# Patient Record
Sex: Female | Born: 1995 | Hispanic: Yes | Marital: Single | Smoking: Never smoker
Health system: Southern US, Community
[De-identification: ages and names within clinical notes are randomized; demographics above are authoritative.]

## PROBLEM LIST (undated history)

## (undated) DIAGNOSIS — Z789 Other specified health status: Secondary | ICD-10-CM

## (undated) DIAGNOSIS — Z862 Personal history of diseases of the blood and blood-forming organs and certain disorders involving the immune mechanism: Secondary | ICD-10-CM

## (undated) HISTORY — PX: WISDOM TOOTH EXTRACTION: SHX21

## (undated) HISTORY — DX: Other specified health status: Z78.9

## (undated) HISTORY — PX: NO PAST SURGERIES: SHX2092

---
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
## 2013-11-11 ENCOUNTER — Inpatient Hospital Stay (HOSPITAL_COMMUNITY): Admit: 2013-11-11 | Payer: Self-pay

## 2014-11-11 ENCOUNTER — Other Ambulatory Visit (HOSPITAL_COMMUNITY)
Admission: RE | Admit: 2014-11-11 | Payer: BLUE CROSS/BLUE SHIELD | Source: Ambulatory Visit | Admitting: Internal Medicine

## 2014-11-11 ENCOUNTER — Other Ambulatory Visit (HOSPITAL_COMMUNITY)
Admission: RE | Admit: 2014-11-11 | Discharge: 2014-11-11 | Disposition: A | Discharge status: Home / Self Care | Payer: BLUE CROSS/BLUE SHIELD | Source: Ambulatory Visit | Attending: Internal Medicine | Admitting: Internal Medicine

## 2014-11-11 DIAGNOSIS — Z01419 Encounter for gynecological examination (general) (routine) without abnormal findings: Secondary | ICD-10-CM | POA: Diagnosis not present

## 2016-09-16 NOTE — L&D Delivery Note (Signed)
Delivery Note At 5:36 PM a viable female was delivered via Vaginal, Spontaneous (Presentation:occiput anterior  ;  ).  APGAR: 8, 9; weight 7 lb 6 oz (3345 g).   Placenta status: complete , 3 vessel   Cord:  with the following complications: None.  Cord pH: NA  Anesthesia:  Epidural  Episiotomy: None Lacerations: 2nd degree Suture Repair: 3.0 vicryl Est. Blood Loss (mL): 300  Mom to postpartum.  Baby to Couplet care / Skin to Skin.  Rhyen Mazariego J. 09/02/2017, 7:56 AM

## 2017-01-08 LAB — OB RESULTS CONSOLE HEPATITIS B SURFACE ANTIGEN: Hepatitis B Surface Ag: NEGATIVE

## 2017-01-08 LAB — OB RESULTS CONSOLE ABO/RH: RH Type: POSITIVE

## 2017-01-08 LAB — OB RESULTS CONSOLE HIV ANTIBODY (ROUTINE TESTING): HIV: NONREACTIVE

## 2017-01-13 ENCOUNTER — Other Ambulatory Visit: Payer: Self-pay | Admitting: Obstetrics and Gynecology

## 2017-01-13 ENCOUNTER — Other Ambulatory Visit (HOSPITAL_COMMUNITY)
Admission: RE | Admit: 2017-01-13 | Discharge: 2017-01-13 | Disposition: A | Discharge status: Home / Self Care | Payer: BLUE CROSS/BLUE SHIELD | Source: Ambulatory Visit | Attending: Obstetrics and Gynecology | Admitting: Obstetrics and Gynecology

## 2017-01-13 DIAGNOSIS — Z01419 Encounter for gynecological examination (general) (routine) without abnormal findings: Secondary | ICD-10-CM | POA: Diagnosis not present

## 2017-01-13 DIAGNOSIS — Z113 Encounter for screening for infections with a predominantly sexual mode of transmission: Secondary | ICD-10-CM | POA: Insufficient documentation

## 2017-01-14 LAB — CYTOLOGY - PAP: Neisseria Gonorrhea: NEGATIVE

## 2017-08-14 LAB — OB RESULTS CONSOLE GBS: GBS: NEGATIVE

## 2017-08-27 ENCOUNTER — Telehealth (HOSPITAL_COMMUNITY): Payer: Self-pay | Admitting: *Deleted

## 2017-08-27 ENCOUNTER — Encounter (HOSPITAL_COMMUNITY): Payer: Self-pay | Admitting: *Deleted

## 2017-08-27 NOTE — Telephone Encounter (Signed)
Preadmission screen  

## 2017-09-01 ENCOUNTER — Inpatient Hospital Stay (HOSPITAL_COMMUNITY): Payer: Medicaid Other | Admitting: Anesthesiology

## 2017-09-01 ENCOUNTER — Encounter (HOSPITAL_COMMUNITY): Payer: Self-pay | Admitting: *Deleted

## 2017-09-01 ENCOUNTER — Inpatient Hospital Stay (HOSPITAL_COMMUNITY)
Admission: AD | Admit: 2017-09-01 | Discharge: 2017-09-03 | DRG: 806 | Disposition: A | Discharge status: Home / Self Care | Payer: Medicaid Other | Source: Ambulatory Visit | Attending: Obstetrics and Gynecology | Admitting: Obstetrics and Gynecology

## 2017-09-01 ENCOUNTER — Other Ambulatory Visit: Payer: Self-pay

## 2017-09-01 DIAGNOSIS — D693 Immune thrombocytopenic purpura: Secondary | ICD-10-CM | POA: Diagnosis present

## 2017-09-01 DIAGNOSIS — O9912 Other diseases of the blood and blood-forming organs and certain disorders involving the immune mechanism complicating childbirth: Secondary | ICD-10-CM | POA: Diagnosis present

## 2017-09-01 DIAGNOSIS — O1404 Mild to moderate pre-eclampsia, complicating childbirth: Secondary | ICD-10-CM | POA: Diagnosis present

## 2017-09-01 DIAGNOSIS — Z3A40 40 weeks gestation of pregnancy: Secondary | ICD-10-CM

## 2017-09-01 DIAGNOSIS — O1490 Unspecified pre-eclampsia, unspecified trimester: Secondary | ICD-10-CM | POA: Diagnosis present

## 2017-09-01 LAB — URINALYSIS, ROUTINE W REFLEX MICROSCOPIC
Ketones, ur: NEGATIVE mg/dL
Nitrite: NEGATIVE
Protein, ur: 100 mg/dL — AB
RBC / HPF: NONE SEEN RBC/hpf (ref 0–5)
pH: 6 (ref 5.0–8.0)

## 2017-09-01 LAB — CBC
HCT: 33.9 % — ABNORMAL LOW (ref 36.0–46.0)
Hemoglobin: 12 g/dL (ref 12.0–15.0)
Hemoglobin: 12.6 g/dL (ref 12.0–15.0)
Hemoglobin: 10.9 g/dL — ABNORMAL LOW (ref 12.0–15.0)
MCH: 27.2 pg (ref 26.0–34.0)
MCH: 27.5 pg (ref 26.0–34.0)
MCH: 27.1 pg (ref 26.0–34.0)
MCHC: 32.6 g/dL (ref 30.0–36.0)
MCHC: 32.7 g/dL (ref 30.0–36.0)
MCHC: 32.2 g/dL (ref 30.0–36.0)
MCV: 83.4 fL (ref 78.0–100.0)
MCV: 84.3 fL (ref 78.0–100.0)
Platelets: 150 10*3/uL (ref 150–400)
Platelets: 135 10*3/uL — ABNORMAL LOW (ref 150–400)
RBC: 4.41 MIL/uL (ref 3.87–5.11)
RBC: 4.58 MIL/uL (ref 3.87–5.11)
RBC: 4.02 MIL/uL (ref 3.87–5.11)
RDW: 14.1 % (ref 11.5–15.5)
WBC: 11.7 10*3/uL — ABNORMAL HIGH (ref 4.0–10.5)

## 2017-09-01 LAB — COMPREHENSIVE METABOLIC PANEL
ALT: 12 U/L — ABNORMAL LOW (ref 14–54)
Alkaline Phosphatase: 230 U/L — ABNORMAL HIGH (ref 38–126)
Anion gap: 11 (ref 5–15)
Calcium: 8.3 mg/dL — ABNORMAL LOW (ref 8.9–10.3)
Creatinine, Ser: 0.45 mg/dL (ref 0.44–1.00)
GFR calc Af Amer: >60 mL/min (ref >60)
GFR calc non Af Amer: >60 mL/min (ref >60)
Sodium: 135 mmol/L (ref 135–145)
Total Bilirubin: 0.4 mg/dL (ref 0.3–1.2)
Total Protein: 6.4 g/dL — ABNORMAL LOW (ref 6.5–8.1)

## 2017-09-01 LAB — TYPE AND SCREEN
ABO/RH(D): A POS
Antibody Screen: NEGATIVE

## 2017-09-01 LAB — LACTATE DEHYDROGENASE: LDH: 138 U/L (ref 98–192)

## 2017-09-01 LAB — ABO/RH: ABO/RH(D): A POS

## 2017-09-01 LAB — RPR: RPR Ser Ql: NONREACTIVE

## 2017-09-01 LAB — PROTEIN / CREATININE RATIO, URINE: Total Protein, Urine: 49 mg/dL

## 2017-09-01 MED ORDER — SIMETHICONE 80 MG PO CHEW: 80.0000 mg | CHEWABLE_TABLET | ORAL | Status: DC | PRN

## 2017-09-01 MED ORDER — ZOLPIDEM TARTRATE 5 MG PO TABS: 5.0000 mg | ORAL_TABLET | Freq: Every evening | ORAL | Status: DC | PRN

## 2017-09-01 MED ORDER — ONDANSETRON HCL 4 MG/2ML IJ SOLN: 4.0000 mg | Freq: Four times a day (QID) | INTRAMUSCULAR | Status: DC | PRN

## 2017-09-01 MED ORDER — MAGNESIUM SULFATE 40 G IN LACTATED RINGERS - SIMPLE
2.0000 g/h | INTRAVENOUS | Status: AC
  Administered 2017-09-02: 2 g/h via INTRAVENOUS
  Filled 2017-09-01: qty 40
  Filled 2017-09-01: qty 500

## 2017-09-01 MED ORDER — DIPHENHYDRAMINE HCL 50 MG/ML IJ SOLN: 12.5000 mg | INTRAMUSCULAR | Status: DC | PRN

## 2017-09-01 MED ORDER — SENNOSIDES-DOCUSATE SODIUM 8.6-50 MG PO TABS
2.0000 | ORAL_TABLET | ORAL | Status: DC
  Administered 2017-09-01 – 2017-09-03 (×2): 2 via ORAL
  Filled 2017-09-01 (×2): qty 2

## 2017-09-01 MED ORDER — TERBUTALINE SULFATE 1 MG/ML IJ SOLN: 0.2500 mg | Freq: Once | INTRAMUSCULAR | Status: DC | PRN

## 2017-09-01 MED ORDER — HYDRALAZINE HCL 20 MG/ML IJ SOLN: 10.0000 mg | Freq: Once | INTRAMUSCULAR | Status: DC | PRN

## 2017-09-01 MED ORDER — LABETALOL HCL 5 MG/ML IV SOLN: 20.0000 mg | INTRAVENOUS | Status: DC | PRN

## 2017-09-01 MED ORDER — LIDOCAINE HCL (PF) 1 % IJ SOLN
INTRAMUSCULAR | Status: DC | PRN
  Administered 2017-09-01: 6 mL via EPIDURAL
  Administered 2017-09-01: 4 mL

## 2017-09-01 MED ORDER — LACTATED RINGERS IV SOLN
INTRAVENOUS | Status: DC
  Administered 2017-09-02: 50 mL/h via INTRAVENOUS

## 2017-09-01 MED ORDER — PHENYLEPHRINE 40 MCG/ML (10ML) SYRINGE FOR IV PUSH (FOR BLOOD PRESSURE SUPPORT)
80.0000 ug | PREFILLED_SYRINGE | INTRAVENOUS | Status: DC | PRN
  Filled 2017-09-01: qty 5

## 2017-09-01 MED ORDER — ACETAMINOPHEN 325 MG PO TABS: 650.0000 mg | ORAL_TABLET | ORAL | Status: DC | PRN

## 2017-09-01 MED ORDER — BENZOCAINE-MENTHOL 20-0.5 % EX AERO
1.0000 "application " | INHALATION_SPRAY | CUTANEOUS | Status: DC | PRN
  Administered 2017-09-01: 1 via TOPICAL
  Filled 2017-09-01: qty 56

## 2017-09-01 MED ORDER — FENTANYL 2.5 MCG/ML BUPIVACAINE 1/10 % EPIDURAL INFUSION (WH - ANES)
14.0000 mL/h | INTRAMUSCULAR | Status: DC | PRN
  Administered 2017-09-01: 14 mL/h via EPIDURAL
  Filled 2017-09-01: qty 100

## 2017-09-01 MED ORDER — OXYCODONE HCL 5 MG PO TABS: 5.0000 mg | ORAL_TABLET | ORAL | Status: DC | PRN

## 2017-09-01 MED ORDER — OXYCODONE-ACETAMINOPHEN 5-325 MG PO TABS: 2.0000 | ORAL_TABLET | ORAL | Status: DC | PRN

## 2017-09-01 MED ORDER — OXYTOCIN BOLUS FROM INFUSION
500.0000 mL | Freq: Once | INTRAVENOUS | Status: AC
  Administered 2017-09-01: 500 mL via INTRAVENOUS

## 2017-09-01 MED ORDER — MAGNESIUM SULFATE BOLUS VIA INFUSION
4.0000 g | Freq: Once | INTRAVENOUS | Status: AC
  Administered 2017-09-01: 4 g via INTRAVENOUS
  Filled 2017-09-01: qty 500

## 2017-09-01 MED ORDER — EPHEDRINE 5 MG/ML INJ
10.0000 mg | INTRAVENOUS | Status: DC | PRN
  Filled 2017-09-01: qty 2

## 2017-09-01 MED ORDER — WITCH HAZEL-GLYCERIN EX PADS: 1.0000 "application " | MEDICATED_PAD | CUTANEOUS | Status: DC | PRN

## 2017-09-01 MED ORDER — DIBUCAINE 1 % RE OINT: 1.0000 "application " | TOPICAL_OINTMENT | RECTAL | Status: DC | PRN

## 2017-09-01 MED ORDER — OXYCODONE HCL 5 MG PO TABS: 10.0000 mg | ORAL_TABLET | ORAL | Status: DC | PRN

## 2017-09-01 MED ORDER — LACTATED RINGERS IV SOLN
500.0000 mL | Freq: Once | INTRAVENOUS | Status: AC
  Administered 2017-09-01: 1000 mL via INTRAVENOUS

## 2017-09-01 MED ORDER — MAGNESIUM HYDROXIDE 400 MG/5ML PO SUSP: 30.0000 mL | ORAL | Status: DC | PRN

## 2017-09-01 MED ORDER — FLEET ENEMA 7-19 GM/118ML RE ENEM: 1.0000 | ENEMA | RECTAL | Status: DC | PRN

## 2017-09-01 MED ORDER — ONDANSETRON HCL 4 MG PO TABS: 4.0000 mg | ORAL_TABLET | ORAL | Status: DC | PRN

## 2017-09-01 MED ORDER — LACTATED RINGERS IV SOLN: 500.0000 mL | INTRAVENOUS | Status: DC | PRN

## 2017-09-01 MED ORDER — ONDANSETRON HCL 4 MG/2ML IJ SOLN: 4.0000 mg | INTRAMUSCULAR | Status: DC | PRN

## 2017-09-01 MED ORDER — DIPHENHYDRAMINE HCL 25 MG PO CAPS: 25.0000 mg | ORAL_CAPSULE | Freq: Four times a day (QID) | ORAL | Status: DC | PRN

## 2017-09-01 MED ORDER — PRENATAL MULTIVITAMIN CH
1.0000 | ORAL_TABLET | Freq: Every day | ORAL | Status: DC
  Administered 2017-09-02 – 2017-09-03 (×2): 1 via ORAL
  Filled 2017-09-01 (×2): qty 1

## 2017-09-01 MED ORDER — PHENYLEPHRINE 40 MCG/ML (10ML) SYRINGE FOR IV PUSH (FOR BLOOD PRESSURE SUPPORT)
80.0000 ug | PREFILLED_SYRINGE | INTRAVENOUS | Status: DC | PRN
  Filled 2017-09-01: qty 5
  Filled 2017-09-01: qty 10

## 2017-09-01 MED ORDER — OXYTOCIN 40 UNITS IN LACTATED RINGERS INFUSION - SIMPLE MED
1.0000 m[IU]/min | INTRAVENOUS | Status: DC
  Administered 2017-09-01: 2 m[IU]/min via INTRAVENOUS

## 2017-09-01 MED ORDER — OXYCODONE-ACETAMINOPHEN 5-325 MG PO TABS: 1.0000 | ORAL_TABLET | ORAL | Status: DC | PRN

## 2017-09-01 MED ORDER — SOD CITRATE-CITRIC ACID 500-334 MG/5ML PO SOLN: 30.0000 mL | ORAL | Status: DC | PRN

## 2017-09-01 MED ORDER — IBUPROFEN 600 MG PO TABS
600.0000 mg | ORAL_TABLET | Freq: Four times a day (QID) | ORAL | Status: DC
  Administered 2017-09-01 – 2017-09-03 (×7): 600 mg via ORAL
  Filled 2017-09-01 (×7): qty 1

## 2017-09-01 MED ORDER — OXYTOCIN 40 UNITS IN LACTATED RINGERS INFUSION - SIMPLE MED
2.5000 [IU]/h | INTRAVENOUS | Status: DC
  Administered 2017-09-01: 2.5 [IU]/h via INTRAVENOUS
  Filled 2017-09-01: qty 1000

## 2017-09-01 MED ORDER — LIDOCAINE HCL (PF) 1 % IJ SOLN
30.0000 mL | INTRAMUSCULAR | Status: DC | PRN
  Filled 2017-09-01: qty 30

## 2017-09-01 MED ORDER — COCONUT OIL OIL
1.0000 "application " | TOPICAL_OIL | Status: DC | PRN
  Administered 2017-09-01: 1 via TOPICAL
  Filled 2017-09-01: qty 120

## 2017-09-01 MED ORDER — LACTATED RINGERS IV SOLN: INTRAVENOUS | Status: DC

## 2017-09-01 NOTE — Progress Notes (Signed)
Autumn Bridges is a 21 y.o. G1P0000 at 2761w3d by  admitted for induction of labor due to preeclampsia .  Subjective: Contractions more painful. Desires an epidural . No headache/ visual disturbances or ruq pain   Objective: BP 137/73   Pulse 83   Temp 98.4 F (36.9 C) (Oral)   Resp 18   Ht 5\' 2"  (1.575 m)   Wt 68.9 kg (152 lb)   SpO2 99%   BMI 27.80 kg/m  No intake/output data recorded. No intake/output data recorded.  FHT:  FHR: 130 bpm, variability: moderate,  accelerations:  Present,  decelerations:  Absent UC:   regular, every 2 minutes SVE:   Dilation: 3.5 Effacement (%): 90 Station: -2 Exam by:: Dr. Richardson Doppole  Labs: Lab Results  Component Value Date   WBC 11.8 (H) 09/01/2017   HGB 12.6 09/01/2017   HCT 38.5 09/01/2017   MCV 84.1 09/01/2017   PLT 150 09/01/2017    Assessment / Plan: Induction of labor due to preeclampsia,  progressing well on pitocin  Labor: Progressing on Pitocin, will continue to increase then AROM Preeclampsia:  labs stable Fetal Wellbeing:  Category I Pain Control:  planning epidural  I/D:  n/a Anticipated MOD:  NSVD  Jaiel Saraceno J. 09/01/2017, 1:12 PM

## 2017-09-01 NOTE — Anesthesia Preprocedure Evaluation (Signed)

## 2017-09-01 NOTE — MAU Note (Signed)
Pt here with c/o contractions that worsened about 0100. Denies any bleeding or leaking. Lost mucus plug. Reports good fetal movement.

## 2017-09-01 NOTE — Progress Notes (Signed)
Patient requested bottle for formula feeding.Instructions and benefits, advantages and disadvantages of breast feeding given to patient verbally.  Patient still requested bottle for formula feeding.

## 2017-09-01 NOTE — Anesthesia Pain Management Evaluation Note (Signed)
  CRNA Pain Management Visit Note  Patient: Autumn MoroMaria Arellano, 21 y.o., female  "Hello I am a member of the anesthesia team at [facility]. We have an anesthesia team available at all times to provide care throughout the hospital, including epidural management and anesthesia for C-section. I don't know your plan for the delivery whether it a natural birth, water birth, IV sedation, nitrous supplementation, doula or epidural, but we want to meet your pain goals."   1.Was your pain managed to your expectations on prior hospitalizations?   No prior hospitalizations  2.What is your expectation for pain management during this hospitalization?     Epidural and IV pain meds  3.How can we help you reach that goal? Possible epidural  Record the patient's initial score and the patient's pain goal.   Pain: 7  Pain Goal: 8 [facility] wants you to be able to say your pain was always managed very well.  Marjorie Lussier 09/01/2017

## 2017-09-01 NOTE — Progress Notes (Signed)
Autumn Bridges is a 21 y.o. G1P0000 at 4565w3d by  admitted for induction of labor due to preeclampsia .  Subjective: Patient denies headache or visual disturbances. Contractions are mild   Objective: BP 137/73   Pulse 83   Temp 98.4 F (36.9 C) (Oral)   Resp 18   Ht 5\' 2"  (1.575 m)   Wt 68.9 kg (152 lb)   SpO2 99%   BMI 27.80 kg/m  No intake/output data recorded. No intake/output data recorded.  FHT:  FHR: 130 bpm, variability: moderate,  accelerations:  Present,  decelerations:  Absent UC:   irregular, every 5 minutes SVE:   2.5/80/-2 Labs: Lab Results  Component Value Date   WBC 11.8 (H) 09/01/2017   HGB 12.6 09/01/2017   HCT 38.5 09/01/2017   MCV 84.1 09/01/2017   PLT 150 09/01/2017    Assessment / Plan: start pitocin for augmentation   Labor: start pitocin 2x2 Preeclampsia:  labs stable Fetal Wellbeing:  Category I Pain Control:  Labor support without medications I/D:  n/a Anticipated MOD:  NSVD  Autumn Bridges J. 09/01/2017, 1:10 PM

## 2017-09-01 NOTE — MAU Note (Addendum)
Pt reports strong uc's since 0100

## 2017-09-01 NOTE — Anesthesia Procedure Notes (Signed)
Epidural Patient location during procedure: OB  Staffing Anesthesiologist: Marek Nghiem, MD  Preanesthetic Checklist Completed: patient identified, pre-op evaluation, timeout performed, IV checked, risks and benefits discussed and monitors and equipment checked  Epidural Patient position: sitting Prep: DuraPrep Patient monitoring: blood pressure and continuous pulse ox Approach: right paramedian Location: L3-L4 Injection technique: LOR air  Needle:  Needle type: Tuohy  Needle gauge: 17 G Needle insertion depth: 4 cm Catheter type: closed end flexible Catheter size: 19 Gauge Catheter at skin depth: 10 cm Test dose: negative  Assessment Sensory level: T8  Additional Notes  Dosing of Epidural:  1st dose, through catheter .............................................  Xylocaine 40 mg  2nd dose, through catheter, after waiting 3 minutes.........Xylocaine 60 mg    As each dose occurred, patient was free of IV sx; and patient exhibited no evidence of SA injection.  Patient is more comfortable after epidural dosed. Please see RN's note for documentation of vital signs,and FHR which are stable.  Patient reminded not to try to ambulate with numb legs, and that an RN must be present when she attempts to get up.           

## 2017-09-01 NOTE — H&P (Signed)
Autumn Bridges is a 21 y.o. female, G1P0 at 40.3 weeks, presenting for contractions.  Denies leaking fluid or bloody show. FM+.  Denies headache or blurred vision.  Prenatal hx remarkable for hx of idiopathic thrombocytopenia.    Patient Active Problem List   Diagnosis Date Noted  . Indication for care in labor or delivery 09/01/2017    History of present pregnancy: Patient entered care at 7.3 weeks.   EDC of 08/29/2017 was established by LMP.   Anatomy scan:  20 weeks, with normal findings Additional US evaluations:  None Significant prenatal events:  None Last evaluation:  Last week  OB History    Gravida Para Term Preterm AB Living   1             SAB TAB Ectopic Multiple Live Births                 Past Medical History:  Diagnosis Date  . Medical history non-contributory    Past Surgical History:  Procedure Laterality Date  . NO PAST SURGERIES     Family History: family history includes Diabetes in her mother. Social History:  reports that  has never smoked. she has never used smokeless tobacco. She reports that she does not drink alcohol or use drugs.   Prenatal Transfer Tool  Maternal Diabetes: No Genetic Screening: Declined Maternal Ultrasounds/Referrals: Normal Fetal Ultrasounds or other Referrals:  None Maternal Substance Abuse:  No Significant Maternal Medications:  None Significant Maternal Lab Results: None  TDAP No Flu Yes  ROS:  All 10 systems reviewed and neg except as stated above.  No Known Allergies   Dilation: 2.5 Effacement (%): 80 Station: -2 Exam by:: Salia Cangemi CNM Blood pressure (!) 141/85, pulse 87, temperature 98.1 F (36.7 C), temperature source Oral, resp. rate 16, height 5\' 2"  (1.575 m), weight 68.9 kg (152 lb), SpO2 99 %.  Chest clear Heart RRR without murmur Abd gravid, NT, FH appropriate EFW 7.5 lbs Pelvic: adequate Ext: +3 refexes, no clonus, no edema  FHR: Category 1 UCs:  3 in 10 minutes  Prenatal labs: ABO,  Rh: A/Positive/-- (04/25 0000) Antibody: Negative (04/25 0000) Rubella:  Immune (04/25 0000) RPR: Nonreactive (04/25 0000)  HBsAg: Negative (04/25 0000)  HIV: Non-reactive (04/25 0000)  GBS: Negative (11/29 0000) Sickle cell/Hgb electrophoresis:  AA Pap:  2018 negative GC: Neg Chlamydia: Neg Genetic screenings:  Declined Glucola:  98 Other:   Hgb 13.6 at NOB, 11.8 at 28 weeks  Results for orders placed or performed during the hospital encounter of 09/01/17 (from the past 24 hour(s))  Urinalysis, Routine w reflex microscopic     Status: Abnormal   Collection Time: 09/01/17  2:45 AM  Result Value Ref Range   Color, Urine YELLOW YELLOW   APPearance CLEAR CLEAR   Specific Gravity, Urine 1.021 1.005 - 1.030   pH 6.0 5.0 - 8.0   Glucose, UA NEGATIVE NEGATIVE mg/dL   Hgb urine dipstick NEGATIVE NEGATIVE   Bilirubin Urine NEGATIVE NEGATIVE   Ketones, ur NEGATIVE NEGATIVE mg/dL   Protein, ur 161100 (A) NEGATIVE mg/dL   Nitrite NEGATIVE NEGATIVE   Leukocytes, UA NEGATIVE NEGATIVE   RBC / HPF NONE SEEN 0 - 5 RBC/hpf   WBC, UA 0-5 0 - 5 WBC/hpf   Bacteria, UA RARE (A) NONE SEEN   Squamous Epithelial / LPF 0-5 (A) NONE SEEN   Mucus PRESENT   Protein / creatinine ratio, urine     Status: Abnormal   Collection Time:  09/01/17  2:45 AM  Result Value Ref Range   Creatinine, Urine 161.00 mg/dL   Total Protein, Urine 49 mg/dL   Protein Creatinine Ratio 0.30 (H) 0.00 - 0.15 mg/mg[Cre]  CBC     Status: Abnormal   Collection Time: 09/01/17  3:23 AM  Result Value Ref Range   WBC 7.8 4.0 - 10.5 K/uL   RBC 4.41 3.87 - 5.11 MIL/uL   Hemoglobin 12.0 12.0 - 15.0 g/dL   HCT 40.936.8 81.136.0 - 91.446.0 %   MCV 83.4 78.0 - 100.0 fL   MCH 27.2 26.0 - 34.0 pg   MCHC 32.6 30.0 - 36.0 g/dL   RDW 78.213.9 95.611.5 - 21.315.5 %   Platelets 142 (L) 150 - 400 K/uL  Comprehensive metabolic panel     Status: Abnormal   Collection Time: 09/01/17  3:23 AM  Result Value Ref Range   Sodium 135 135 - 145 mmol/L   Potassium 3.8 3.5  - 5.1 mmol/L   Chloride 106 101 - 111 mmol/L   CO2 18 (L) 22 - 32 mmol/L   Glucose, Bld 84 65 - 99 mg/dL   BUN 14 6 - 20 mg/dL   Creatinine, Ser 0.860.45 0.44 - 1.00 mg/dL   Calcium 8.3 (L) 8.9 - 10.3 mg/dL   Total Protein 6.4 (L) 6.5 - 8.1 g/dL   Albumin 2.8 (L) 3.5 - 5.0 g/dL   AST 20 15 - 41 U/L   ALT 12 (L) 14 - 54 U/L   Alkaline Phosphatase 230 (H) 38 - 126 U/L   Total Bilirubin 0.4 0.3 - 1.2 mg/dL   GFR calc non Af Amer >60 >60 mL/min   GFR calc Af Amer >60 >60 mL/min   Anion gap 11 5 - 15      Assessment/Plan: IUP at 40.3 pre eclampsia with mild features Cat 1 strip Hx of idiopathic thrombocytopenia  Plan: Admit to Birthing Suite  Routine [facility] orders Pain med/epidural prn Pitocin if needed  Henderson NewcomerNancy Jean ProtheroCNM, MSN 09/01/2017, 5:19 AM

## 2017-09-02 LAB — CBC
MCHC: 32.7 g/dL (ref 30.0–36.0)
Platelets: 136 10*3/uL — ABNORMAL LOW (ref 150–400)
RDW: 14.1 % (ref 11.5–15.5)

## 2017-09-02 LAB — COMPREHENSIVE METABOLIC PANEL
Albumin: 2.3 g/dL — ABNORMAL LOW (ref 3.5–5.0)
Anion gap: 7 (ref 5–15)
CO2: 19 mmol/L — ABNORMAL LOW (ref 22–32)
Glucose, Bld: 93 mg/dL (ref 65–99)
Potassium: 3.9 mmol/L (ref 3.5–5.1)
Sodium: 133 mmol/L — ABNORMAL LOW (ref 135–145)

## 2017-09-02 NOTE — Progress Notes (Signed)
Post Partum Day 1 Subjective: no complaints, up ad lib, voiding and tolerating PO Denies headache visual disturbances or ruq pain  Objective: Blood pressure 121/61, pulse 82, temperature 98.2 F (36.8 C), temperature source Oral, resp. rate 16, height 5\' 2"  (1.575 m), weight 66.6 kg (146 lb 12 oz), SpO2 99 %, unknown if currently breastfeeding.  Physical Exam:  General: alert, cooperative and no distress Lochia: appropriate Uterine Fundus: firm Incision: NA DVT Evaluation: No evidence of DVT seen on physical exam.  Recent Labs    09/01/17 1817 09/02/17 0521  HGB 10.9* 10.8*  HCT 33.9* 33.0*    Assessment/Plan: Breastfeeding and Lactation consult  Preeclampsia. Continue magnesium for total of 24 hours from start  Possible discharge home tomorrow if bp well controlled after cessation of Magnesium sulfate.     LOS: 1 day   Geral Coker J. 09/02/2017, 8:02 AM

## 2017-09-02 NOTE — Lactation Note (Addendum)
This note was copied from a baby's chart. Lactation Consultation Note  Patient Name: Autumn Andree MoroMaria Arellano RUEAV'WToday's Date: 09/02/2017 Reason for consult: Initial assessment Baby at 21 hours of life. Mom desires to offer breast and formula bottles. She reports baby is not latching well. Nipples are intact, no bruising noted, positive for areolar edema, reviewed reverse pressure, breast shells were given. Discussed baby behavior, feeding frequency, pumping, supplementing, artifical nipple, baby belly size, voids, wt loss, breast changes, and nipple care. Demonstrated manual expression, colostrum noted bilaterally, reviewed spoon feeding. Given lactation handouts. Aware of OP services and support group.       Maternal Data    Feeding    LATCH Score                   Interventions Interventions: Breast feeding basics reviewed;Breast massage;Hand express;Reverse pressure;Breast compression;Expressed milk;Coconut oil;Shells;DEBP;Ice  Lactation Tools Discussed/Used     Consult Status Consult Status: Follow-up Date: 09/03/17 Follow-up type: In-patient   Milagros S. Pasty Archeck Demoni Gergen E Sequoia Witz 09/02/2017, 2:59 PM

## 2017-09-02 NOTE — Anesthesia Postprocedure Evaluation (Signed)
Anesthesia Post Note  Patient: Autumn MoroMaria Bridges  Procedure(s) Performed: AN AD HOC LABOR EPIDURAL     Patient location during evaluation: [facility] Anesthesia Type: Epidural Level of consciousness: awake and alert Pain management: pain level controlled Vital Signs Assessment: post-procedure vital signs reviewed and stable Respiratory status: spontaneous breathing, nonlabored ventilation and respiratory function stable Cardiovascular status: stable Postop Assessment: no headache, no backache and epidural receding Anesthetic complications: no    Last Vitals:  Vitals:   09/02/17 0530 09/02/17 0630  BP: 124/75 121/61  Pulse: 82 82  Resp: 16 16  Temp:    SpO2: 98% 99%    Last Pain:  Vitals:   09/02/17 0730  TempSrc:   PainSc: 0-No pain   Pain Goal: Patients Stated Pain Goal: 3 (09/02/17 0730)               Junious SilkGILBERT,Jeidi Gilles

## 2017-09-03 DIAGNOSIS — O1490 Unspecified pre-eclampsia, unspecified trimester: Secondary | ICD-10-CM | POA: Diagnosis present

## 2017-09-03 MED ORDER — IBUPROFEN 600 MG PO TABS: 600.0000 mg | ORAL_TABLET | Freq: Four times a day (QID) | ORAL | 0 refills | Status: DC | PRN

## 2017-09-03 NOTE — Discharge Summary (Signed)
OB Discharge Summary     Patient Name: Autumn Bridges DOB: 12/13/1995 MRN: [id number]  Date of admission: 09/01/2017 Delivering MD: Gerald LeitzOLE, Namira Rosekrans   Date of discharge: 09/03/2017  Admitting diagnosis: 40.3 WEEKS CTX with preeclampsia  Intrauterine pregnancy: 9874w3d     Secondary diagnosis:  Active Problems:   Indication for care in labor or delivery   Preeclampsia  Additional problems: preeclampsia      Discharge diagnosis: Term Pregnancy Delivered and Preeclampsia (mild)                                                                                                Post partum procedures:NA  Augmentation: Pitocin  Complications: None  Hospital course:  Induction of Labor With Vaginal Delivery   21 y.o. yo G1P1001 at 374w3d was admitted to the hospital 09/01/2017 for induction of labor.  Indication for induction: Preeclampsia.  Patient had an uncomplicated labor course as follows: Membrane Rupture Time/Date: 4:09 PM ,09/01/2017   Intrapartum Procedures: Episiotomy: None [1]                                         Lacerations:  2nd degree [3]  Patient had delivery of a Viable infant.  Information for the patient's newborn:  Lyanne Corellano, Girl Zuria [[id number]][[id number]]  Delivery Method: Vag-Spont   09/01/2017  Details of delivery can be found in separate delivery note.  Patient had a routine postpartum course. Patient is discharged home 09/03/17.  Physical exam  Vitals:   09/03/17 0010 09/03/17 0453 09/03/17 0800 09/03/17 1200  BP: (!) 116/53 (!) 107/55 128/67 127/67  Pulse: 69 62 71 63  Resp: 16 16 16 18   Temp: 98.6 F (37 C) 98.2 F (36.8 C) 98.4 F (36.9 C) 98 F (36.7 C)  TempSrc: Oral Oral Oral Oral  SpO2: 99% 100% 99%   Weight:      Height:       General: alert, cooperative and no distress Lochia: appropriate Uterine Fundus: firm Incision:NA DVT Evaluation: No evidence of DVT seen on physical exam. Labs: Lab Results  Component Value Date   WBC 13.0 (H) 09/02/2017    HGB 10.8 (L) 09/02/2017   HCT 33.0 (L) 09/02/2017   MCV 84.0 09/02/2017   PLT 136 (L) 09/02/2017   CMP Latest Ref Rng & Units 09/02/2017  Glucose 65 - 99 mg/dL 93  BUN 6 - 20 mg/dL 7  Creatinine 7.820.44 - 9.561.00 mg/dL 2.130.52  Sodium 086135 - 578145 mmol/L 133(L)  Potassium 3.5 - 5.1 mmol/L 3.9  Chloride 101 - 111 mmol/L 107  CO2 22 - 32 mmol/L 19(L)  Calcium 8.9 - 10.3 mg/dL 6.9(L)  Total Protein 6.5 - 8.1 g/dL 5.3(L)  Total Bilirubin 0.3 - 1.2 mg/dL 4.6(N0.2(L)  Alkaline Phos 38 - 126 U/L 166(H)  AST 15 - 41 U/L 26  ALT 14 - 54 U/L 13(L)    Discharge instruction: per After Visit Summary and "Baby and Me Booklet".  After visit meds:  Allergies as of 09/03/2017  No Known Allergies     Medication List    TAKE these medications   ibuprofen 600 MG tablet Commonly known as:  ADVIL,MOTRIN Take 1 tablet (600 mg total) by mouth every 6 (six) hours as needed.   prenatal multivitamin Tabs tablet Take 1 tablet by mouth daily at 12 noon.       Diet: low salt diet  Activity: Advance as tolerated. Pelvic rest for 6 weeks.   Outpatient follow up:1 week bp check  Follow up Appt:No future appointments. Follow up Visit:No Follow-up on file.  Postpartum contraception: Not Discussed  Newborn Data: Live born female  Birth Weight: 7 lb 6 oz (3345 g) APGAR: 8, 9  Newborn Delivery   Birth date/time:  09/01/2017 17:36:00 Delivery type:  Vaginal, Spontaneous     Baby Feeding: Breast Disposition:home with mother   09/03/2017 Jessee AversOLE,Desyre Calma J., MD

## 2017-09-04 NOTE — Addendum Note (Signed)
Addendum  created 09/04/17 0732 by Achille RichHodierne, Fynley Chrystal, MD   Intraprocedure Staff edited

## 2017-09-06 ENCOUNTER — Inpatient Hospital Stay (HOSPITAL_COMMUNITY): Admission: RE | Admit: 2017-09-06 | Payer: Medicaid Other | Source: Ambulatory Visit

## 2021-01-01 ENCOUNTER — Encounter (HOSPITAL_COMMUNITY): Payer: Self-pay | Admitting: Obstetrics and Gynecology

## 2021-01-01 ENCOUNTER — Other Ambulatory Visit: Payer: Self-pay

## 2021-01-01 ENCOUNTER — Inpatient Hospital Stay (HOSPITAL_BASED_OUTPATIENT_CLINIC_OR_DEPARTMENT_OTHER): Payer: Medicaid Other

## 2021-01-01 ENCOUNTER — Inpatient Hospital Stay (HOSPITAL_COMMUNITY)
Admission: AD | Admit: 2021-01-01 | Discharge: 2021-01-01 | Disposition: A | Discharge status: Home / Self Care | Payer: Medicaid Other | Attending: Obstetrics and Gynecology | Admitting: Obstetrics and Gynecology

## 2021-01-01 DIAGNOSIS — O039 Complete or unspecified spontaneous abortion without complication: Secondary | ICD-10-CM | POA: Diagnosis not present

## 2021-01-01 DIAGNOSIS — O209 Hemorrhage in early pregnancy, unspecified: Secondary | ICD-10-CM | POA: Diagnosis present

## 2021-01-01 DIAGNOSIS — Z791 Long term (current) use of non-steroidal anti-inflammatories (NSAID): Secondary | ICD-10-CM | POA: Insufficient documentation

## 2021-01-01 DIAGNOSIS — O034 Incomplete spontaneous abortion without complication: Secondary | ICD-10-CM

## 2021-01-01 DIAGNOSIS — Z3A14 14 weeks gestation of pregnancy: Secondary | ICD-10-CM | POA: Diagnosis not present

## 2021-01-01 DIAGNOSIS — O30001 Twin pregnancy, unspecified number of placenta and unspecified number of amniotic sacs, first trimester: Secondary | ICD-10-CM

## 2021-01-01 HISTORY — DX: Personal history of diseases of the blood and blood-forming organs and certain disorders involving the immune mechanism: Z86.2

## 2021-01-01 LAB — URINALYSIS, ROUTINE W REFLEX MICROSCOPIC
Bilirubin Urine: NEGATIVE
Glucose, UA: NEGATIVE mg/dL
Ketones, ur: NEGATIVE mg/dL
Nitrite: NEGATIVE
Protein, ur: NEGATIVE mg/dL
Specific Gravity, Urine: 1.004 — ABNORMAL LOW (ref 1.005–1.030)
pH: 8 (ref 5.0–8.0)

## 2021-01-01 LAB — CBC
HCT: 38.4 % (ref 36.0–46.0)
Hemoglobin: 13.1 g/dL (ref 12.0–15.0)
MCH: 29.6 pg (ref 26.0–34.0)
MCHC: 34.1 g/dL (ref 30.0–36.0)
MCV: 86.9 fL (ref 80.0–100.0)
Platelets: 221 10*3/uL (ref 150–400)
RBC: 4.42 MIL/uL (ref 3.87–5.11)
RDW: 13.4 % (ref 11.5–15.5)
WBC: 6.9 10*3/uL (ref 4.0–10.5)
nRBC: 0 % (ref 0.0–0.2)

## 2021-01-01 LAB — POCT PREGNANCY, URINE: Preg Test, Ur: POSITIVE — AB

## 2021-01-01 LAB — HCG, QUANTITATIVE, PREGNANCY: hCG, Beta Chain, Quant, S: 6679 m[IU]/mL — ABNORMAL HIGH (ref <5)

## 2021-01-01 NOTE — MAU Provider Note (Signed)
History     CSN: [id number]  Arrival date and time: 01/01/21 1706   Event Date/Time   First Provider Initiated Contact with Patient 01/01/21 1737      Chief Complaint  Patient presents with  . Vaginal Bleeding   HPI Autumn Bridges is a 25 y.o. G2P1001 at [redacted]w[redacted]d with twin gestation who presents to MAU with chief complaint of vaginal bleeding. This is a new problem, onset shortly before patient's arrival to MAU. She endorses seeing a small amount of bright red spotting.  She denies heavy vaginal bleeding, abdominal pain, abdominal tenderness, fever or recent illness. She is remote from sexual intercourse.  Patient also c/o intermittent headache and mildly blurry vision. She denies HTN. She denies severe headache pain. Pain score 0/10 on arrival to MAU.  Patient has initiated care with [facility].  OB History    Gravida  2   Para  1   Term  1   Preterm  0   AB  0   Living  1     SAB  0   IAB  0   Ectopic  0   Multiple  0   Live Births  1           Past Medical History:  Diagnosis Date  . History of ITP   . Medical history non-contributory     Past Surgical History:  Procedure Laterality Date  . NO PAST SURGERIES    . WISDOM TOOTH EXTRACTION      Family History  Problem Relation Age of Onset  . Diabetes Mother     Social History   Tobacco Use  . Smoking status: Never Smoker  . Smokeless tobacco: Never Used  Vaping Use  . Vaping Use: Never used  Substance Use Topics  . Alcohol use: No  . Drug use: No    Allergies: No Known Allergies  Medications Prior to Admission  Medication Sig Dispense Refill Last Dose  . cholecalciferol (VITAMIN D3) 25 MCG (1000 UNIT) tablet Take 1,000 Units by mouth daily.   01/01/2021 at Unknown time  . Prenatal Vit-Fe Fumarate-FA (PRENATAL MULTIVITAMIN) TABS tablet Take 1 tablet by mouth daily at 12 noon.   01/01/2021 at Unknown time  . ibuprofen (ADVIL,MOTRIN) 600 MG tablet Take 1 tablet (600 mg total) by mouth  every 6 (six) hours as needed. 30 tablet 0     Review of Systems  Genitourinary: Positive for vaginal bleeding.  All other systems reviewed and are negative.  Physical Exam   Blood pressure 118/64, pulse 70, temperature 97.9 F (36.6 C), temperature source Oral, resp. rate 16, height 5\' 1"  (1.549 m), weight 58.2 kg, SpO2 100 %, unknown if currently breastfeeding.  Physical Exam Vitals and nursing note reviewed. Exam conducted with a chaperone present.  Constitutional:      Appearance: Normal appearance.  Cardiovascular:     Rate and Rhythm: Normal rate.     Pulses: Normal pulses.     Heart sounds: Normal heart sounds.  Pulmonary:     Effort: Pulmonary effort is normal.     Breath sounds: Normal breath sounds.  Abdominal:     Palpations: Abdomen is soft.     Tenderness: There is no right CVA tenderness or left CVA tenderness.  Skin:    Capillary Refill: Capillary refill takes less than 2 seconds.  Neurological:     Mental Status: She is alert and oriented to person, place, and time.  Psychiatric:  Mood and Affect: Mood normal.        Behavior: Behavior normal.        Thought Content: Thought content normal.        Judgment: Judgment normal.     MAU Course  Procedures  --IUP with twin gestation previously confirmed in office with [facility] --Able to obtain clear view of both Baby A and Baby B on BSUS. Absent movement and absent cardiac activity x 2. Dr. Adrian Blackwater at bedside, identical assessment. Formal ultrasound ordered. --Rhea Pink, CNM contacted to coordinate treatment options and followup in office. Requests I contact Dr. Normand Sloop. Dr. Normand Sloop notified, states patient should be d/c'd home if stable with plan to follow up in office on Wednesday for discussion of next steps.  Patient Vitals for the past 24 hrs:  BP Temp Temp src Pulse Resp SpO2 Height Weight  01/01/21 1903 118/71 -- -- 74 14 100 % -- --  01/01/21 1737 120/65 -- -- 69 -- -- -- --  01/01/21 1722  118/64 97.9 F (36.6 C) Oral 70 16 100 % 5\' 1"  (1.549 m) 58.2 kg   Results for orders placed or performed during the hospital encounter of 01/01/21 (from the past 24 hour(s))  Pregnancy, urine POC     Status: Abnormal   Collection Time: 01/01/21  5:22 PM  Result Value Ref Range   Preg Test, Ur POSITIVE (A) NEGATIVE  Urinalysis, Routine w reflex microscopic Urine, Clean Catch     Status: Abnormal   Collection Time: 01/01/21  5:36 PM  Result Value Ref Range   Color, Urine STRAW (A) YELLOW   APPearance CLEAR CLEAR   Specific Gravity, Urine 1.004 (L) 1.005 - 1.030   pH 8.0 5.0 - 8.0   Glucose, UA NEGATIVE NEGATIVE mg/dL   Hgb urine dipstick MODERATE (A) NEGATIVE   Bilirubin Urine NEGATIVE NEGATIVE   Ketones, ur NEGATIVE NEGATIVE mg/dL   Protein, ur NEGATIVE NEGATIVE mg/dL   Nitrite NEGATIVE NEGATIVE   Leukocytes,Ua TRACE (A) NEGATIVE   WBC, UA 0-5 0 - 5 WBC/hpf   Bacteria, UA RARE (A) NONE SEEN   Squamous Epithelial / LPF 0-5 0 - 5   Mucus PRESENT    Assessment and Plan  --25 y.o. G2P1001 at [redacted]w[redacted]d  --Demise of both twins confirmed via formal ultrasound --Scant bleeding, Hgb 13.1 --Blood type A POS --Per Dr. [redacted]w[redacted]d, discharge home in stable condition  F/U: --Dr. Normand Sloop to coordinate evaluation in office on Wednesday 01/03/2021  01/05/2021, CNM 01/01/2021, 7:29 PM

## 2021-01-01 NOTE — MAU Note (Addendum)
Her vision is blurring today.  Had a HA earlier,gone now. Not having any pain. Had a pinch pain in RLQ earlier- brief.  When she got home she realized she was spotting.  Is pregnant with twins, per [place]. Has started care at [facility]

## 2021-01-01 NOTE — Discharge Instructions (Signed)
Miscarriage A miscarriage is the loss of a pregnancy before the 20th week of pregnancy. Sometimes, a pregnancy ends before a woman knows that she is pregnant. If you lose a pregnancy, talk with your doctor about:  Questions you have about the loss of your baby.  How to work through your grief.  Plans for future pregnancy. What are the causes? Many times, the cause of this condition is not known. What increases the risk? These things may make a pregnant woman more likely to lose a pregnancy: Certain health conditions  Conditions that affect hormones, such as: ? Thyroid disease. ? Polycystic ovary syndrome.  Diabetes.  A disease that causes the body's disease-fighting system to attack itself by mistake.  Infections.  Bleeding problems.  Being very overweight. Lifestyle factors  Using products that have tobacco or nicotine in them.  Being around tobacco smoke.  Having alcohol.  Having a lot of caffeine.  Using drugs. Problems with reproductive organs or parts  Having a cervix that opens and thins before your due date. The cervix is the lowest part of your womb.  Having Asherman syndrome, which leads to: ? Scars in the womb. ? The womb being abnormal in shape.  Growths (fibroids) in the womb.  Problems in the body that are present at birth.  Infection of the cervix or womb. Personal or health history  Injury.  Having lost a pregnancy before.  Being younger than age 18 or older than age 35.  Being around a harmful substance, such as radiation.  Having lead or other heavy metals in: ? Things you eat or drink. ? The air around you.  Using certain medicines. What are the signs or symptoms?  Blood or spots of blood coming from the vagina. You may also have cramps or pain.  Pain or cramps in the belly or low back.  Fluid or tissue coming out of the vagina. How is this treated? Sometimes, treatment is not needed. If you need treatment, you may be  treated with:  A procedure to open the cervix more and take tissue out of the womb.  Medicines. You may get a shot of medicine called Rho(D) immune globulin. Follow these instructions at home: Medicines  Take over-the-counter and prescription medicines only as told by your doctor.  If you were prescribed antibiotic medicine, take it as told by your doctor. Do not stop taking it even if you start to feel better. Activity  Rest as told by your doctor. Ask your doctor what activities are safe for you.  Have someone help you at home during this time. General instructions  Watch how much tissue comes out of the vagina.  Watch the size of any blood clots that come out of the vagina.  Do not have sex or douche until your doctor says it is okay.  Do not put things, such as tampons, in your vagina until your doctor says it is okay.  To help you and your partner with grieving: ? Talk with your doctor. ? See a counselor.  When you are ready, talk with your doctor about: ? Things to do for your health. ? How you can be healthy if you get pregnant again.  Keep all follow-up visits.   Where to find more information  The American College of Obstetricians and Gynecologists: acog.org  [place] Department of Health and Human Services Office of Women's Health: hrsa.gov/office-womens-health Contact a doctor if:  You have a fever or chills.  There is bad-smelling fluid coming   from the vagina.  You have more bleeding.  Tissue or clots of blood come out of your vagina. Get help right away if:  You have very bad cramps or pain in your back or belly.  You soak more than 2 large pads in an hour for more than 2 hours.  You get light-headed or weak.  You faint.  You feel sad, and you have sad thoughts a lot of the time.  You think about hurting yourself. Get help right awayif you feel like you may hurt yourself or others, or have thoughts about taking your own life. Go to your nearest  emergency room or:  Call your local emergency services (911 in the [place]  Call the National Suicide Prevention Lifeline at 1-800-273-8255. This is open 24 hours a day.  Text the Crisis Text Line at 741741. Summary  A miscarriage is the loss of a pregnancy before the 20th week of pregnancy. Sometimes, a pregnancy ends before a woman knows that she is pregnant.  Follow instructions from your doctor about medicines and activity.  To help you and your partner with grieving, talk with your doctor or a counselor.  Keep all follow-up visits. This information is not intended to replace advice given to you by your health care provider. Make sure you discuss any questions you have with your health care provider. Document Revised: 03/03/2020 Document Reviewed: 03/03/2020 Elsevier Patient Education  2021 Elsevier Inc.  

## 2021-01-02 DIAGNOSIS — O021 Missed abortion: Secondary | ICD-10-CM | POA: Diagnosis not present

## 2021-01-02 DIAGNOSIS — O30042 Twin pregnancy, dichorionic/diamniotic, second trimester: Secondary | ICD-10-CM

## 2021-01-02 DIAGNOSIS — Z3A00 Weeks of gestation of pregnancy not specified: Secondary | ICD-10-CM

## 2021-01-04 ENCOUNTER — Other Ambulatory Visit (HOSPITAL_COMMUNITY): Payer: Self-pay | Admitting: Obstetrics and Gynecology

## 2021-01-04 ENCOUNTER — Other Ambulatory Visit: Payer: Self-pay | Admitting: Obstetrics and Gynecology

## 2021-01-04 DIAGNOSIS — O039 Complete or unspecified spontaneous abortion without complication: Secondary | ICD-10-CM

## 2021-01-05 ENCOUNTER — Other Ambulatory Visit (HOSPITAL_COMMUNITY): Payer: Medicaid Other

## 2021-01-08 ENCOUNTER — Other Ambulatory Visit (HOSPITAL_COMMUNITY)
Admission: RE | Admit: 2021-01-08 | Discharge: 2021-01-08 | Disposition: A | Discharge status: Home / Self Care | Payer: Medicaid Other | Source: Ambulatory Visit | Attending: Obstetrics and Gynecology | Admitting: Obstetrics and Gynecology

## 2021-01-08 ENCOUNTER — Encounter (HOSPITAL_COMMUNITY): Payer: Self-pay | Admitting: Obstetrics and Gynecology

## 2021-01-08 DIAGNOSIS — Z20822 Contact with and (suspected) exposure to covid-19: Secondary | ICD-10-CM | POA: Diagnosis not present

## 2021-01-08 DIAGNOSIS — Z01812 Encounter for preprocedural laboratory examination: Secondary | ICD-10-CM | POA: Insufficient documentation

## 2021-01-08 NOTE — Progress Notes (Signed)
Spoke with pt for pre-op call. Pt denies cardiac history, HTN or Diabetes.  Covid test done today, result pending. Pt states she's been in quarantine since the test was done and understands that she stays in quarantine until she comes to the hospital

## 2021-01-09 ENCOUNTER — Ambulatory Visit (HOSPITAL_COMMUNITY)
Admission: RE | Admit: 2021-01-09 | Discharge: 2021-01-09 | Disposition: A | Discharge status: Home / Self Care | Payer: Medicaid Other | Source: Ambulatory Visit | Attending: Obstetrics and Gynecology | Admitting: Obstetrics and Gynecology

## 2021-01-09 ENCOUNTER — Ambulatory Visit (HOSPITAL_COMMUNITY): Payer: Medicaid Other | Admitting: Certified Registered Nurse Anesthetist

## 2021-01-09 ENCOUNTER — Encounter (HOSPITAL_COMMUNITY)
Admission: RE | Disposition: A | Discharge status: Home / Self Care | Payer: Self-pay | Source: Ambulatory Visit | Attending: Obstetrics and Gynecology

## 2021-01-09 ENCOUNTER — Ambulatory Visit (HOSPITAL_COMMUNITY): Payer: Medicaid Other

## 2021-01-09 ENCOUNTER — Encounter (HOSPITAL_COMMUNITY): Payer: Self-pay | Admitting: Obstetrics and Gynecology

## 2021-01-09 ENCOUNTER — Other Ambulatory Visit: Payer: Self-pay

## 2021-01-09 DIAGNOSIS — O029 Abnormal product of conception, unspecified: Secondary | ICD-10-CM

## 2021-01-09 DIAGNOSIS — O034 Incomplete spontaneous abortion without complication: Secondary | ICD-10-CM | POA: Insufficient documentation

## 2021-01-09 DIAGNOSIS — Z01818 Encounter for other preprocedural examination: Secondary | ICD-10-CM

## 2021-01-09 DIAGNOSIS — O039 Complete or unspecified spontaneous abortion without complication: Secondary | ICD-10-CM

## 2021-01-09 HISTORY — PX: OPERATIVE ULTRASOUND: SHX5996

## 2021-01-09 HISTORY — PX: DILATION AND EVACUATION: SHX1459

## 2021-01-09 LAB — CBC
HCT: 37.3 % (ref 36.0–46.0)
Hemoglobin: 12.7 g/dL (ref 12.0–15.0)
MCH: 29.3 pg (ref 26.0–34.0)
MCHC: 34 g/dL (ref 30.0–36.0)
MCV: 86.1 fL (ref 80.0–100.0)
Platelets: 206 10*3/uL (ref 150–400)
RBC: 4.33 MIL/uL (ref 3.87–5.11)
RDW: 13.2 % (ref 11.5–15.5)
WBC: 11.8 10*3/uL — ABNORMAL HIGH (ref 4.0–10.5)
nRBC: 0 % (ref 0.0–0.2)

## 2021-01-09 LAB — SARS CORONAVIRUS 2 (TAT 6-24 HRS): SARS Coronavirus 2: NEGATIVE

## 2021-01-09 SURGERY — DILATION AND EVACUATION, UTERUS
Anesthesia: General | Site: Uterus

## 2021-01-09 MED ORDER — OXYCODONE HCL 5 MG PO TABS
5.0000 mg | ORAL_TABLET | Freq: Once | ORAL | Status: DC | PRN
Start: 2021-01-09 — End: 2021-01-09

## 2021-01-09 MED ORDER — EPHEDRINE 5 MG/ML INJ
INTRAVENOUS | Status: AC
  Filled 2021-01-09: qty 10

## 2021-01-09 MED ORDER — FENTANYL CITRATE (PF) 250 MCG/5ML IJ SOLN
INTRAMUSCULAR | Status: DC | PRN
  Administered 2021-01-09: 100 ug via INTRAVENOUS

## 2021-01-09 MED ORDER — LIDOCAINE 2% (20 MG/ML) 5 ML SYRINGE
INTRAMUSCULAR | Status: DC | PRN
  Administered 2021-01-09: 60 mg via INTRAVENOUS

## 2021-01-09 MED ORDER — CHLORHEXIDINE GLUCONATE 0.12 % MT SOLN: 15.0000 mL | Freq: Once | OROMUCOSAL | Status: DC

## 2021-01-09 MED ORDER — LIDOCAINE HCL 2 % IJ SOLN
INTRAMUSCULAR | Status: DC | PRN
  Administered 2021-01-09: 20 mL

## 2021-01-09 MED ORDER — POVIDONE-IODINE 10 % EX SWAB: 2.0000 "application " | Freq: Once | CUTANEOUS | Status: DC

## 2021-01-09 MED ORDER — DEXAMETHASONE SODIUM PHOSPHATE 10 MG/ML IJ SOLN
INTRAMUSCULAR | Status: DC | PRN
  Administered 2021-01-09: 5 mg via INTRAVENOUS

## 2021-01-09 MED ORDER — ORAL CARE MOUTH RINSE: 15.0000 mL | Freq: Once | OROMUCOSAL | Status: DC

## 2021-01-09 MED ORDER — PROPOFOL 10 MG/ML IV BOLUS
INTRAVENOUS | Status: AC
  Filled 2021-01-09: qty 20

## 2021-01-09 MED ORDER — MIDAZOLAM HCL 2 MG/2ML IJ SOLN
INTRAMUSCULAR | Status: DC | PRN
  Administered 2021-01-09: 2 mg via INTRAVENOUS

## 2021-01-09 MED ORDER — SILVER NITRATE-POT NITRATE 75-25 % EX MISC
CUTANEOUS | Status: AC
  Filled 2021-01-09: qty 10

## 2021-01-09 MED ORDER — PHENYLEPHRINE 40 MCG/ML (10ML) SYRINGE FOR IV PUSH (FOR BLOOD PRESSURE SUPPORT)
PREFILLED_SYRINGE | INTRAVENOUS | Status: DC | PRN
  Administered 2021-01-09: 120 ug via INTRAVENOUS
  Administered 2021-01-09: 80 ug via INTRAVENOUS

## 2021-01-09 MED ORDER — MIDAZOLAM HCL 2 MG/2ML IJ SOLN
INTRAMUSCULAR | Status: AC
  Filled 2021-01-09: qty 2

## 2021-01-09 MED ORDER — LIDOCAINE HCL 2 % IJ SOLN
INTRAMUSCULAR | Status: AC
  Filled 2021-01-09: qty 20

## 2021-01-09 MED ORDER — ONDANSETRON HCL 4 MG/2ML IJ SOLN
INTRAMUSCULAR | Status: DC | PRN
  Administered 2021-01-09: 4 mg via INTRAVENOUS

## 2021-01-09 MED ORDER — 0.9 % SODIUM CHLORIDE (POUR BTL) OPTIME
TOPICAL | Status: DC | PRN
  Administered 2021-01-09: 1000 mL

## 2021-01-09 MED ORDER — IBUPROFEN 600 MG PO TABS: 600.0000 mg | ORAL_TABLET | Freq: Four times a day (QID) | ORAL | 0 refills | Status: AC | PRN

## 2021-01-09 MED ORDER — DOXYCYCLINE HYCLATE 50 MG PO CAPS: 100.0000 mg | ORAL_CAPSULE | Freq: Two times a day (BID) | ORAL | 0 refills | Status: DC

## 2021-01-09 MED ORDER — DEXAMETHASONE SODIUM PHOSPHATE 10 MG/ML IJ SOLN
INTRAMUSCULAR | Status: AC
  Filled 2021-01-09: qty 1

## 2021-01-09 MED ORDER — METHYLERGONOVINE MALEATE 0.2 MG PO TABS: 0.2000 mg | ORAL_TABLET | Freq: Four times a day (QID) | ORAL | 0 refills | Status: AC

## 2021-01-09 MED ORDER — CHLORHEXIDINE GLUCONATE 0.12 % MT SOLN
OROMUCOSAL | Status: AC
  Filled 2021-01-09: qty 15

## 2021-01-09 MED ORDER — PROMETHAZINE HCL 25 MG/ML IJ SOLN: 6.2500 mg | INTRAMUSCULAR | Status: DC | PRN

## 2021-01-09 MED ORDER — PROPOFOL 10 MG/ML IV BOLUS
INTRAVENOUS | Status: DC | PRN
  Administered 2021-01-09: 150 mg via INTRAVENOUS

## 2021-01-09 MED ORDER — CEFAZOLIN SODIUM-DEXTROSE 2-4 GM/100ML-% IV SOLN
2.0000 g | INTRAVENOUS | Status: AC
  Administered 2021-01-09: 2 g via INTRAVENOUS
  Filled 2021-01-09: qty 100

## 2021-01-09 MED ORDER — ONDANSETRON HCL 4 MG/2ML IJ SOLN
INTRAMUSCULAR | Status: AC
  Filled 2021-01-09: qty 2

## 2021-01-09 MED ORDER — LACTATED RINGERS IV SOLN: INTRAVENOUS | Status: DC

## 2021-01-09 MED ORDER — PHENYLEPHRINE 40 MCG/ML (10ML) SYRINGE FOR IV PUSH (FOR BLOOD PRESSURE SUPPORT)
PREFILLED_SYRINGE | INTRAVENOUS | Status: AC
  Filled 2021-01-09: qty 10

## 2021-01-09 MED ORDER — OXYCODONE HCL 5 MG/5ML PO SOLN: 5.0000 mg | Freq: Once | ORAL | Status: DC | PRN

## 2021-01-09 MED ORDER — EPHEDRINE SULFATE-NACL 50-0.9 MG/10ML-% IV SOSY
PREFILLED_SYRINGE | INTRAVENOUS | Status: DC | PRN
  Administered 2021-01-09: 10 mg via INTRAVENOUS
  Administered 2021-01-09: 5 mg via INTRAVENOUS

## 2021-01-09 MED ORDER — FENTANYL CITRATE (PF) 100 MCG/2ML IJ SOLN: 25.0000 ug | INTRAMUSCULAR | Status: DC | PRN

## 2021-01-09 MED ORDER — FENTANYL CITRATE (PF) 250 MCG/5ML IJ SOLN
INTRAMUSCULAR | Status: AC
  Filled 2021-01-09: qty 5

## 2021-01-09 SURGICAL SUPPLY — 23 items
CATH ROBINSON RED A/P 16FR (CATHETERS) ×3 IMPLANT
DECANTER SPIKE VIAL GLASS SM (MISCELLANEOUS) ×3 IMPLANT
DILATOR CANAL MILEX (MISCELLANEOUS) IMPLANT
FILTER UTR ASPR ASSEMBLY (MISCELLANEOUS) ×3 IMPLANT
GLOVE BIO SURGEON STRL SZ 6.5 (GLOVE) ×3 IMPLANT
GLOVE SURG UNDER POLY LF SZ7 (GLOVE) ×6 IMPLANT
GOWN STRL REUS W/ TWL LRG LVL3 (GOWN DISPOSABLE) ×4 IMPLANT
GOWN STRL REUS W/TWL LRG LVL3 (GOWN DISPOSABLE) ×2
HOSE CONNECTING 18IN BERKELEY (TUBING) ×3 IMPLANT
KIT BERKELEY 1ST TRI 3/8 NO TR (MISCELLANEOUS) ×3 IMPLANT
KIT BERKELEY 1ST TRIMESTER 3/8 (MISCELLANEOUS) ×3 IMPLANT
NS IRRIG 1000ML POUR BTL (IV SOLUTION) ×3 IMPLANT
PACK VAGINAL MINOR WOMEN LF (CUSTOM PROCEDURE TRAY) ×3 IMPLANT
PAD OB MATERNITY 4.3X12.25 (PERSONAL CARE ITEMS) ×3 IMPLANT
SET BERKELEY SUCTION TUBING (SUCTIONS) ×3 IMPLANT
SYR 20ML LL LF (SYRINGE) ×3 IMPLANT
TOWEL GREEN STERILE FF (TOWEL DISPOSABLE) ×6 IMPLANT
TUBE VACURETTE 2ND TRIMESTER (CANNULA) ×3 IMPLANT
UNDERPAD 30X36 HEAVY ABSORB (UNDERPADS AND DIAPERS) ×3 IMPLANT
VACURETTE 10 RIGID CVD (CANNULA) ×3 IMPLANT
VACURETTE 7MM CVD STRL WRAP (CANNULA) IMPLANT
VACURETTE 8 RIGID CVD (CANNULA) IMPLANT
VACURETTE 9 RIGID CVD (CANNULA) IMPLANT

## 2021-01-09 NOTE — Anesthesia Postprocedure Evaluation (Signed)
Anesthesia Post Note  Patient: Beecher Mcardle  Procedure(s) Performed: DILATATION AND EVACUATION (N/A Uterus) OPERATIVE ULTRASOUND (N/A Abdomen)     Patient location during evaluation: PACU Anesthesia Type: General Level of consciousness: awake and alert Pain management: pain level controlled Vital Signs Assessment: post-procedure vital signs reviewed and stable Respiratory status: spontaneous breathing, nonlabored ventilation and respiratory function stable Cardiovascular status: stable and blood pressure returned to baseline Anesthetic complications: no   No complications documented.  Last Vitals:  Vitals:   01/09/21 1650 01/09/21 1655  BP:  104/63  Pulse: 64 69  Resp: 14 15  Temp: 36.4 C   SpO2: 100% 100%    Last Pain:  Vitals:   01/09/21 1630  TempSrc:   PainSc: 0-No pain                 Beryle Lathe

## 2021-01-09 NOTE — Transfer of Care (Signed)
Immediate Anesthesia Transfer of Care Note  Patient: Autumn Bridges  Procedure(s) Performed: DILATATION AND EVACUATION (N/A Uterus) OPERATIVE ULTRASOUND (N/A Abdomen)  Patient Location: PACU  Anesthesia Type:General  Level of Consciousness: drowsy and patient cooperative  Airway & Oxygen Therapy: Patient Spontanous Breathing  Post-op Assessment: Report given to RN  Post vital signs: Reviewed and stable  Last Vitals:  Vitals Value Taken Time  BP 102/59 01/09/21 1554  Temp    Pulse 73 01/09/21 1556  Resp 16 01/09/21 1556  SpO2 100 % 01/09/21 1556  Vitals shown include unvalidated device data.  Last Pain:  Vitals:   01/09/21 1304  TempSrc:   PainSc: 5       Patients Stated Pain Goal: 5 (01/09/21 1304)  Complications: No complications documented.

## 2021-01-09 NOTE — Op Note (Signed)
Pre op DX :  Incomplete abortion 16 weeks by dated  11 weeks by [place].    Postoperative Diagnosis: same Procedure:  dilation and evacuation Anesthesia:  MAC and local Surgeon:  Dr. Charlesetta Garibaldi Asst : none Complications : none Procedure in detail: The patient was taken to the operating room where she was given MAC anesthesia. She was placed in dorsal lithotomy position and prepped and draped in normal sterile fashion. In and out catheter was used to drain the bladder. This was examined and noted to have a 12 week size uterus with no adnexal masses. A bivalve was placed into the vagina. A tenaculum was placed on the cervix. The cervix was infiltrated with 20 cc 2% lidocaine paracervical block. The cervix was  dilated   A size 10 suction curettage was placed into the uterine cavity. A scant amount of products of conception was seen. The suction curettage was removed when a gritty texture was noted. A sharp curette was done along all walls  of the uterus. The suction curet was placed back into the uterine cavity. No further products of conception were obtained. D&E was done with US guidance Sponge lap and needle counts were correct. Patient back in stable condition. The patient understood to be the risks to be, but not  limited to,  Bleeding,  Infection,  damage to internal organs by perforation of the uterus and Asherman syndrome (scarring in the uterus) leading to infertility.

## 2021-01-09 NOTE — H&P (Signed)
Georg Ruddle y.o. female. Who presents with heavy and irreg vaginal bleeding after passing twin pregnancy this morning.  On 4/18 /22 she was diagnosed with a fetal demise of twins and was offered a D&E vs cytotec.  After given the risks and benefits she desires to have a D&E.  We can see the twin pregnancy and on [place] the lining is still thickened.  She is bleeding and passing clots.  .  . .  She denies any CP or SOB.   Pertinent Gynecological History: Contraception: Education given regarding options for contraception, including nothing. Blood transfusions: none Sexually transmitted diseases: none Previous GYN Procedures: none Last mammogram:  Last pap: normal Date: WNL OB History: G2P1   Menstrual History: Menarche age: 40 No LMP recorded. Patient is pregnant.    Past Medical History:  Diagnosis Date  . History of ITP   . Medical history non-contributory    Past Surgical History:  Procedure Laterality Date  . NO PAST SURGERIES    . WISDOM TOOTH EXTRACTION      Current Facility-Administered Medications:  .  [START ON 01/10/2021] ceFAZolin (ANCEF) IVPB 2g/100 mL premix, 2 g, Intravenous, On Call to OR, Erlene Devita, MD .  chlorhexidine (PERIDEX) 0.12 % solution 15 mL, 15 mL, Mouth/Throat, Once **OR** MEDLINE mouth rinse, 15 mL, Mouth Rinse, Once, Beryle Lathe, MD .  chlorhexidine (PERIDEX) 0.12 % solution, , , ,  .  lactated ringers infusion, , Intravenous, Continuous, Elvenia Godden, MD .  lactated ringers infusion, , Intravenous, Continuous, Beryle Lathe, MD .  povidone-iodine 10 % swab 2 application, 2 application, Topical, Once, Johnda Billiot, MD No Known Allergies Review of Systems - Negative except  above   Physical Exam  BP (!) 106/59   Pulse 73   Temp 97.6 F (36.4 C) (Oral)   Resp 18   Ht 5\' 1"  (1.549 m)   Wt 58.1 kg   SpO2 97%   BMI 24.19 kg/m  Constitutional: She appears well-developed and well-nourished.  HENT:  Head: Normocephalic.   Eyes: Pupils are equal, round, and reactive to light.  Neck: Normal range of motion. Neck supple.  Cardiovascular: Regular rhythm.   Respiratory: Effort normal and breath sounds normal.  GI: Soft.  Genitourinary:heavy bleeding Musculoskeletal: Normal range of motion.  Neurological: She is alert.  Skin: Skin is warm.  Psychiatric: She has a normal mood and affect.  Results for orders placed or performed during the hospital encounter of 01/08/21 (from the past 72 hour(s))  SARS CORONAVIRUS 2 (TAT 6-24 HRS) Nasopharyngeal Nasopharyngeal Swab     Status: None   Collection Time: 01/08/21 12:58 PM   Specimen: Nasopharyngeal Swab  Result Value Ref Range   SARS Coronavirus 2 NEGATIVE NEGATIVE    Comment: (NOTE) SARS-CoV-2 target nucleic acids are NOT DETECTED.  The SARS-CoV-2 RNA is generally detectable in upper and lower respiratory specimens during the acute phase of infection. Negative results do not preclude SARS-CoV-2 infection, do not rule out co-infections with other pathogens, and should not be used as the sole basis for treatment or other patient management decisions. Negative results must be combined with clinical observations, patient history, and epidemiological information. The expected result is Negative.  Fact Sheet for Patients: 01/10/21  Fact Sheet for Healthcare Providers: HairSlick.no  This test is not yet approved or cleared by the quierodirigir.com FDA and  has been authorized for detection and/or diagnosis of SARS-CoV-2 by FDA under an Emergency Use Authorization (EUA). This EUA will remain  in effect (meaning this test can be used) for the duration of the COVID-19 declaration under Se ction 564(b)(1) of the Act, 21 U.S.C. section 360bbb-3(b)(1), unless the authorization is terminated or revoked sooner.  Performed at [facility], [address], [place], [place] [address]       Assessment/Plan: Fetal Demise of twins measuring 11 weeks.  Incomplete abortion.    Marland Kitchen  Pt chose surgery.    Risks are but not limited to bleeding, infection, scarring of the uterus and perforation.   Date of Initial H&P: today  History reviewed, patient examined, no change in status, stable for surgery.  Flossie Wexler A 08/05/2011, 11:41 AM

## 2021-01-09 NOTE — Anesthesia Preprocedure Evaluation (Addendum)
Anesthesia Evaluation  Patient identified by MRN, date of birth, ID band Patient awake    Reviewed: Allergy & Precautions, NPO status , Patient's Chart, lab work & pertinent test results  History of Anesthesia Complications Negative for: history of anesthetic complications  Airway Mallampati: I  TM Distance: >3 FB Neck ROM: Full    Dental  (+) Dental Advisory Given, Teeth Intact   Pulmonary neg pulmonary ROS,    Pulmonary exam normal        Cardiovascular hypertension (preeclampsia), Normal cardiovascular exam     Neuro/Psych negative neurological ROS  negative psych ROS   GI/Hepatic negative GI ROS, Neg liver ROS,   Endo/Other  negative endocrine ROS  Renal/GU negative Renal ROS     Musculoskeletal negative musculoskeletal ROS (+)   Abdominal   Peds  Hematology  Hx ITP    Anesthesia Other Findings Covid test negative   Reproductive/Obstetrics  Miscarriage twin pregnancy Preeclampsia                             Anesthesia Physical Anesthesia Plan  ASA: II  Anesthesia Plan: General   Post-op Pain Management:    Induction: Intravenous  PONV Risk Score and Plan: 3 and Treatment may vary due to age or medical condition, Ondansetron, Dexamethasone and Midazolam  Airway Management Planned: LMA  Additional Equipment: None  Intra-op Plan:   Post-operative Plan: Extubation in OR  Informed Consent: I have reviewed the patients History and Physical, chart, labs and discussed the procedure including the risks, benefits and alternatives for the proposed anesthesia with the patient or authorized representative who has indicated his/her understanding and acceptance.     Dental advisory given  Plan Discussed with: CRNA and Anesthesiologist  Anesthesia Plan Comments:        Anesthesia Quick Evaluation

## 2021-01-09 NOTE — Discharge Instructions (Signed)
Incomplete Miscarriage An incomplete miscarriage happens when tissue from pregnancy or part of the placenta, known as products of conception, remain in the body after a miscarriage. A miscarriage is the loss of pregnancy before the 20th week. Most miscarriages happen in the first 3 months of pregnancy. Sometimes, a miscarriage happens before a woman knows she is pregnant. Having a miscarriage can be an emotional experience. If you have had a miscarriage, talk with your health care provider about any questions you may have about:  The loss of your baby.  The grieving process.  Your future pregnancy plans. What are the causes? Many times, the cause of an incomplete miscarriage is not known. What increases the risk? The following factors may make a pregnant woman more likely to have an incomplete miscarriage:  Using a watch-and-wait approach (expectant management) to treat a miscarriage.  Using medicine to treat a miscarriage. What are the signs or symptoms? Symptoms of this condition include:  Vaginal bleeding or spotting, with or without cramps or pain.  Pain or cramping in the abdomen or lower back.  Fluid or tissue coming out of the vagina. How is this diagnosed? This condition may be diagnosed based on:  A physical exam.  Ultrasound. How is this treated? An incomplete miscarriage may be treated with:  Dilation and curettage (D&C). In this procedure, the cervix is stretched open and any remaining pregnancy tissue is removed from the lining of the uterus (endometrium). The cervix is the lowest part of the uterus, which opens into the vagina.  Medicines. These may include: ? Antibiotic medicine, to treat infection. ? Medicine to help any remaining pregnancy tissue come out of the uterus. ? Medicine to reduce (contract) the size of the uterus. These medicines may be given if there is a lot of bleeding. If you have Rh-negative blood, you may be given an injection of Rho(D) immune  globulin to help prevent problems with future pregnancies. Follow these instructions at home: Medicines  Take over-the-counter and prescription medicines only as told by your health care provider.  If you were prescribed antibiotic medicine, take your antibiotic as told by your health care provider. Do not stop taking the antibiotic even if you start to feel better. Activity  Rest as told by your health care provider. Ask your health care provider what activities are safe for you.  Have someone help with home and family responsibilities during this time. General instructions  Monitor how much tissue or blood comes out of the vagina.  Do not have sex, douche, or put anything in your vagina, such as tampons, until your health care provider says it is okay.  To help you and your partner with the grieving process, talk with your health care provider or get counseling to help deal with the pregnancy loss.  When you are ready, meet with your health care provider to discuss any important steps you should take for your health. Also, discuss steps you should take to have a healthy pregnancy in the future.  Keep all follow-up visits. This is important.   Where to find more information  The American College of Obstetricians and Gynecologists: acog.org  [place] Department of Health and Human Services Office of Women's Health: hrsa.gov/office-womens-health Contact a health care provider if:  You have a fever or chills.  There is bad-smelling fluid coming from your vagina.  You have more bleeding instead of less.  Tissue or blood clots come out of your vagina. Get help right away if:  You   have severe cramps or pain in your back or abdomen.  Heavy bleeding soaks through 2 large sanitary pads an hour for more than 2 hours.  You become light-headed or weak.  You faint.  You feel sad, and your sadness takes over your thoughts.  You think about hurting yourself. If you ever feel like you  may hurt yourself or others, or have thoughts about taking your own life, get help right away. Go to your nearest emergency department or:  Call your local emergency services (911 in the [place]  Call a suicide crisis helpline, such as the National Suicide Prevention Lifeline at 1-800-273-8255. This is open 24 hours a day in the [place]  Text the Crisis Text Line at 741741 (in the [place] Summary  An incomplete miscarriage happens when tissue from pregnancy or part of the placenta, known as products of conception, remain in the body after a miscarriage.  Treatment may include a dilation and curettage (D&C) procedure or medicines. In a D&C procedure, tissue is removed from the uterus.  Rest as told by your health care provider. Ask your health care provider what activities are safe for you.  To help you and your partner with the grieving process, talk with your health care provider or get counseling to help deal with the pregnancy loss. This information is not intended to replace advice given to you by your health care provider. Make sure you discuss any questions you have with your health care provider. Document Revised: 03/03/2020 Document Reviewed: 03/03/2020 Elsevier Patient Education  2021 Elsevier Inc.  

## 2021-01-09 NOTE — Anesthesia Procedure Notes (Signed)
Procedure Name: LMA Insertion Date/Time: 01/09/2021 3:06 PM Performed by: De Nurse, CRNA Pre-anesthesia Checklist: Patient identified, Emergency Drugs available, Suction available and Patient being monitored Patient Re-evaluated:Patient Re-evaluated prior to induction Oxygen Delivery Method: Circle System Utilized Preoxygenation: Pre-oxygenation with 100% oxygen Induction Type: IV induction Ventilation: Mask ventilation without difficulty LMA: LMA inserted LMA Size: 4.0 Number of attempts: 1 Placement Confirmation: positive ETCO2 Tube secured with: Tape Dental Injury: Teeth and Oropharynx as per pre-operative assessment

## 2021-01-10 ENCOUNTER — Encounter (HOSPITAL_COMMUNITY): Payer: Self-pay | Admitting: Obstetrics and Gynecology

## 2021-01-10 LAB — RPR: RPR Ser Ql: NONREACTIVE

## 2021-01-11 LAB — SURGICAL PATHOLOGY

## 2021-08-16 IMAGING — US US MFM OB LIMITED
1 series · 15 of 28 positions shown · non-contrast
Comparison: none

[Series 1: us mfm ob limited · 29 acquisitions, 15 frames shown]
[im 1/29]
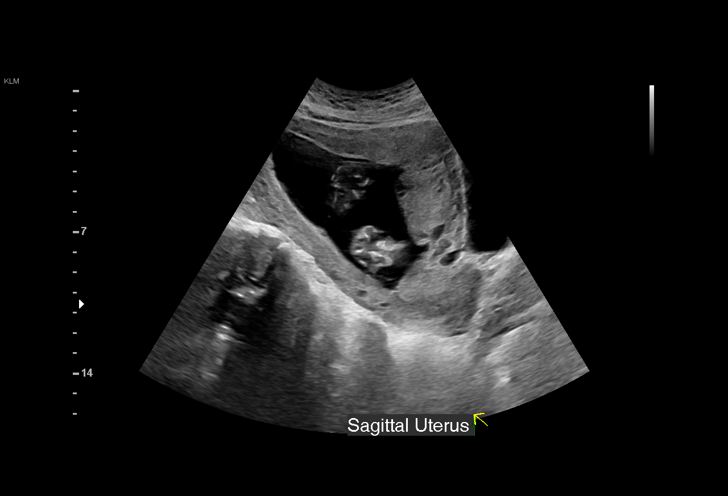
[im 3/29]
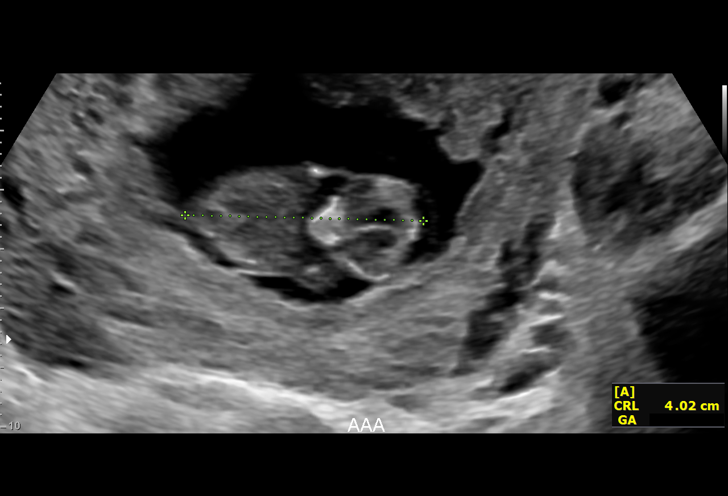
[im 5/29]
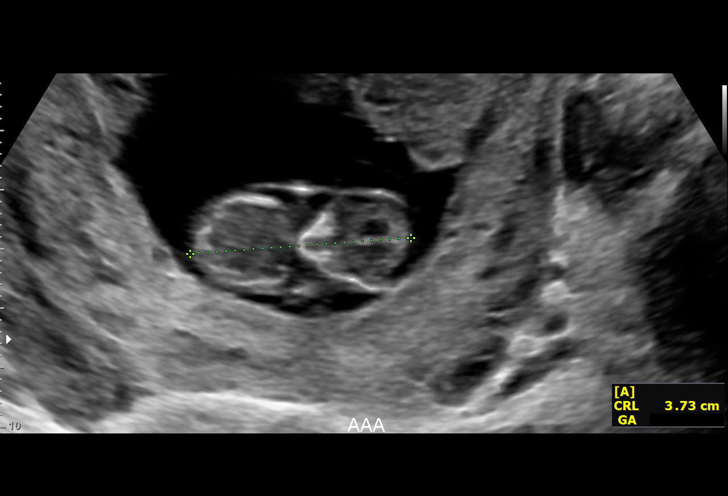
[im 7/29]
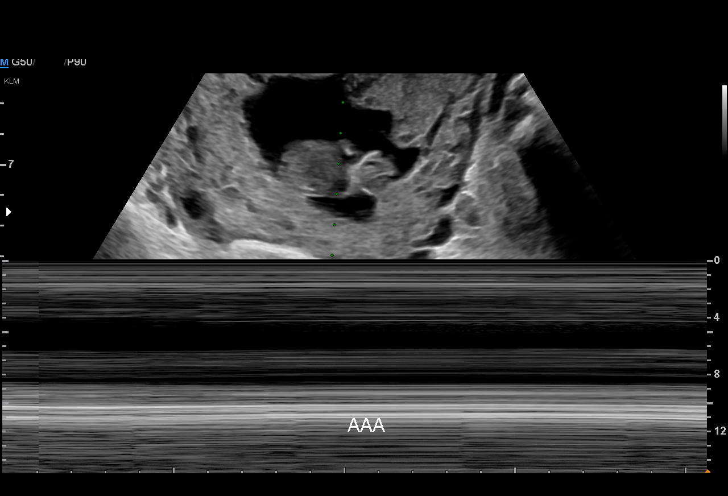
[im 9/29]
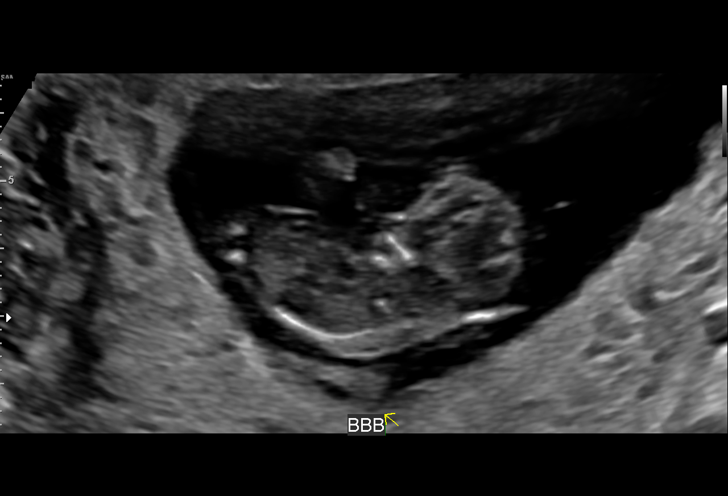
[im 11/29]
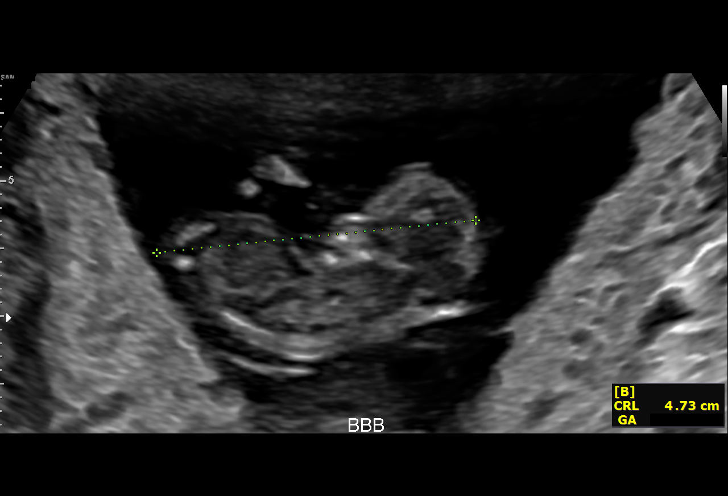
[im 13/29]
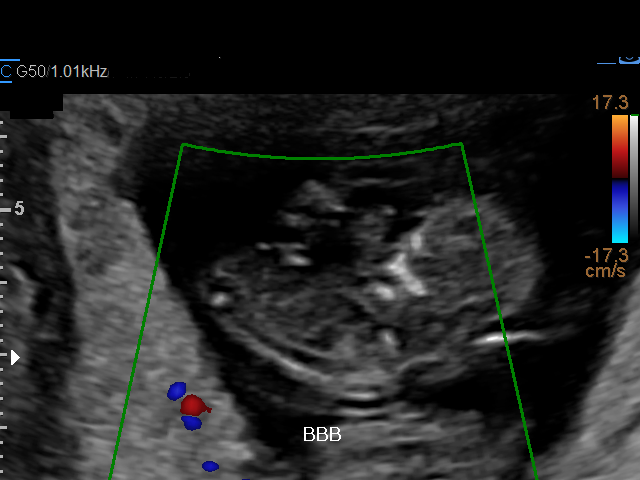
[im 15/29]
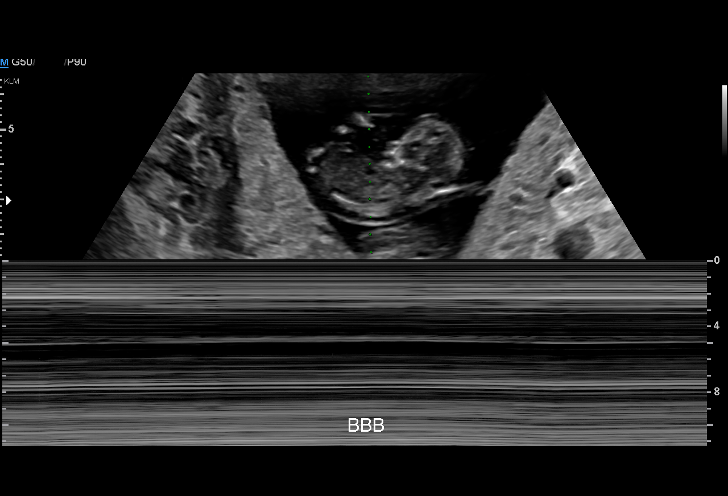
[im 16/29]
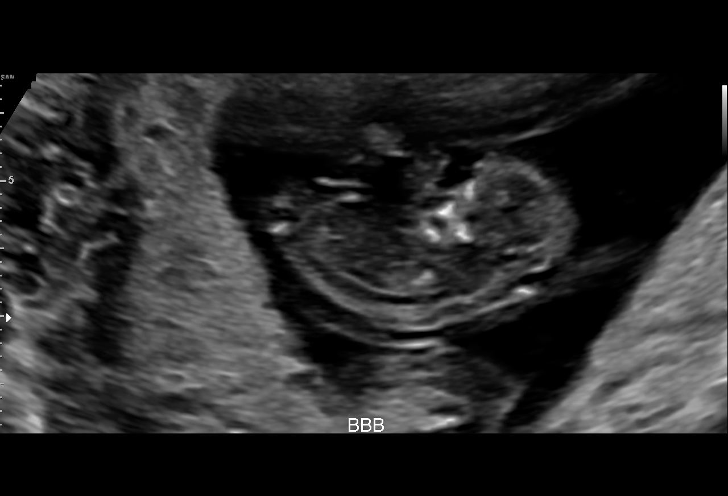
[im 18/29]
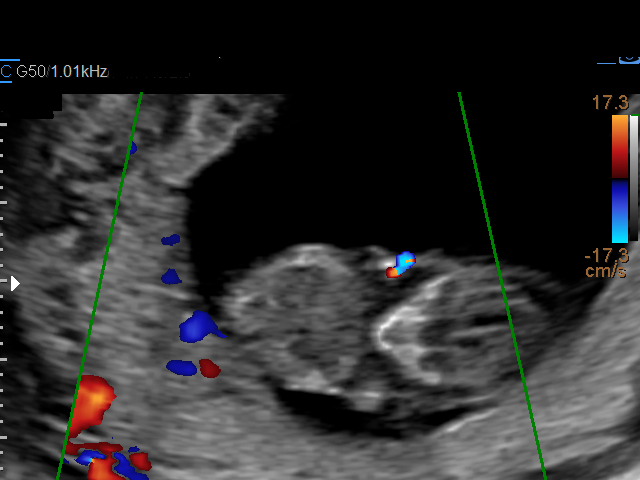
[im 20/29]
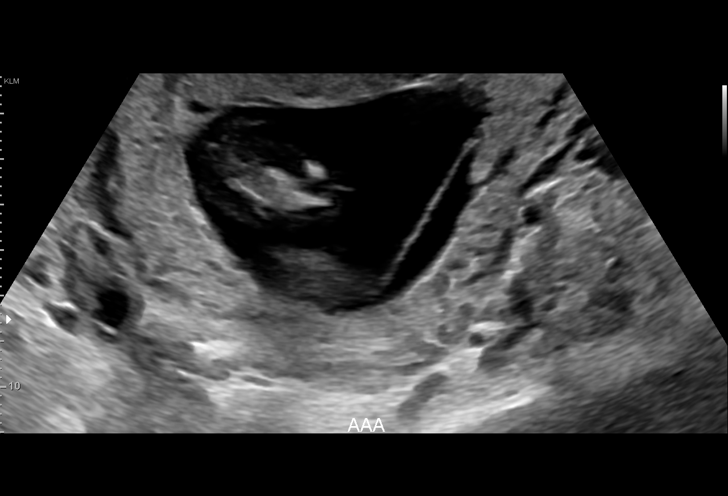
[im 22/29]
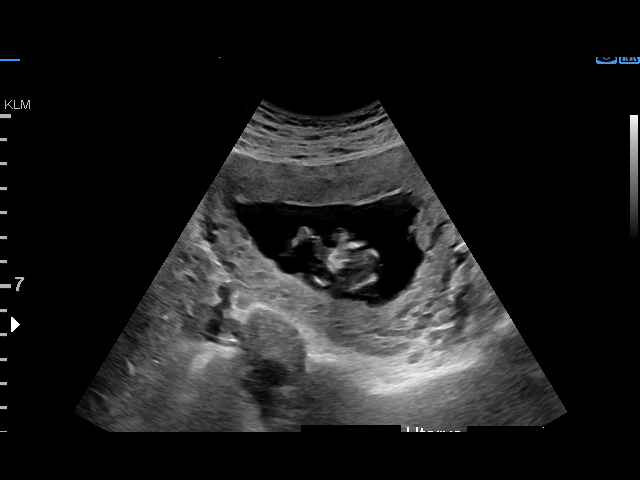
[im 24/29]
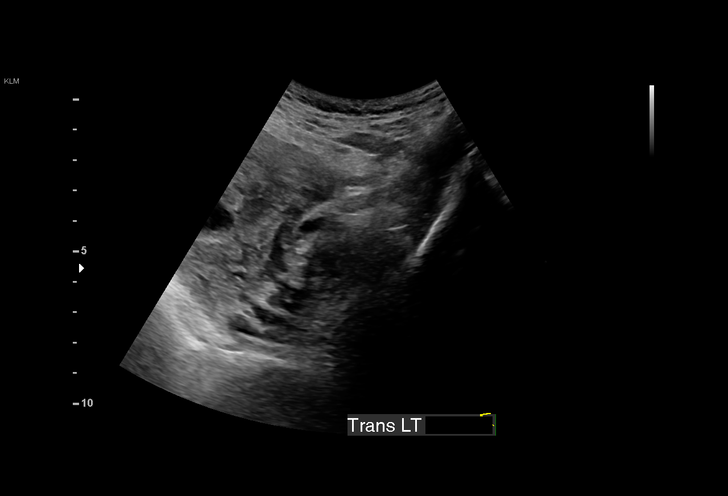
[im 26/29]
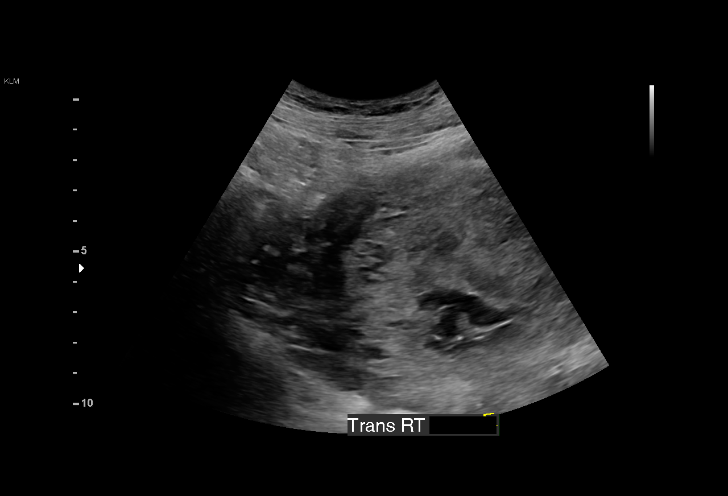
[im 29/29]
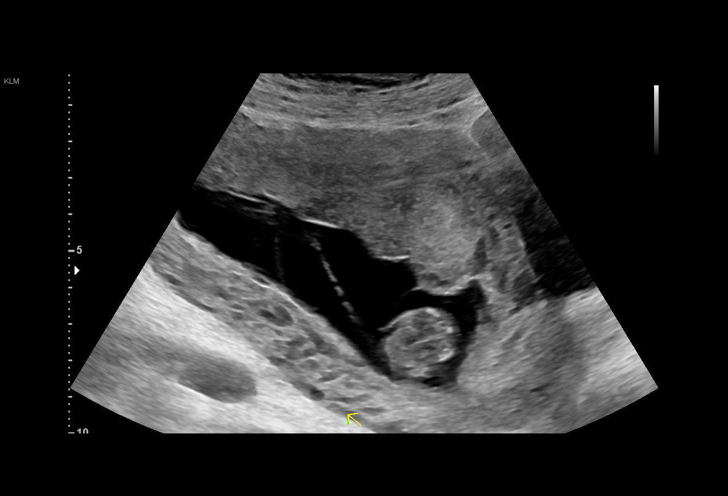

[15 of 28 positions shown; findings below may reference images not displayed]

LUTHER

                   HARDY

 1  US MFM OB LIMITED                     76815.01    TIGER
                                                      HARDY

Indications

 Unable to hear fetal heart tones as reason     O76
 for ultrasound
 Weeks of gestation of pregnancy not
 specified
 Twin pregnancy, di/di, second trimester
Fetal Evaluation (Fetus A)

 Num Of Fetuses:         2
 Cardiac Activity:       Not visualized
 Fetal Lie:              Lower Fetus
 Membrane Desc:      Dividing Membrane seen - Dichorionic.
Biometry (Fetus A)

 CRL:        40  mm     G. Age:  10w 5d                  EDD:   07/25/21

Fetal Evaluation (Fetus B)

 Num Of Fetuses:         2
 Cardiac Activity:       Not visualized
 Fetal Lie:              Upper Fetus
 Membrane Desc:      Dividing Membrane seen - Dichorionic.
Biometry (Fetus B)

 CRL:      47.7  mm     G. Age:  11w 3d                  EDD:   07/20/21
Comments

 This patient presented to the [facility] due to vaginal bleeding.

 She had an ultrasound performed earlier in her pregnancy
 confirming a dichorionic, diamniotic twin gestation.

 On today's exam, a demise of both twin A and twin B were
 noted.  The crown-rump lengths of both twin A and twin B
 measured about 3 weeks behind her dates, indicating that the
 miscarriage most likely occurred a while ago.

 There appears to be a thick dividing membrane between the
 two fetuses indicating that this was a dichorionic, diamniotic
 twin gestation.

## 2021-08-24 IMAGING — US US OB COMP LESS 14 WK
1 series · 14 of 28 positions shown · non-contrast
Comparison: None.

CLINICAL DATA: Recent miscarriage of twin pregnancy. Continued
vaginal bleeding. Evaluate for retained products of conception.

EXAM:
OBSTETRIC <14 WK US AND TRANSVAGINAL OB US
TECHNIQUE: Both transabdominal and transvaginal ultrasound examinations were
performed for complete evaluation of the gestation as well as the
maternal uterus, adnexal regions, and pelvic cul-de-sac.
Transvaginal technique was performed to assess early pregnancy.

[Series 1: us ob comp less 14 wks · 14 of 104 slices shown]
[im 4/104]
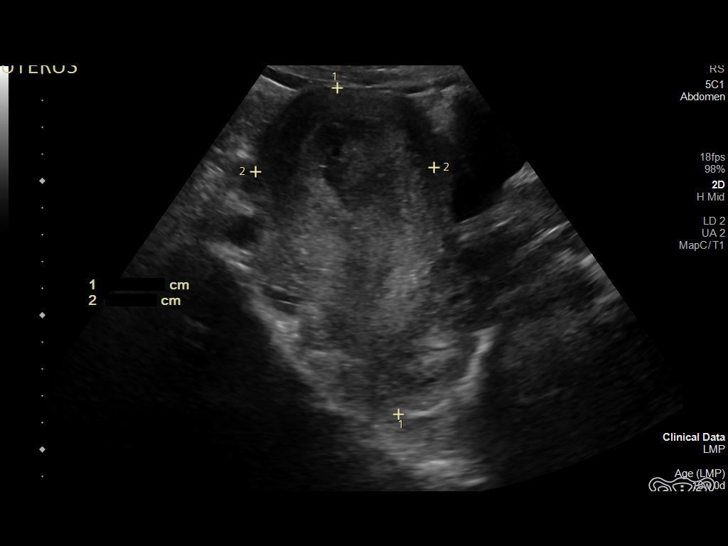
[im 12/104]
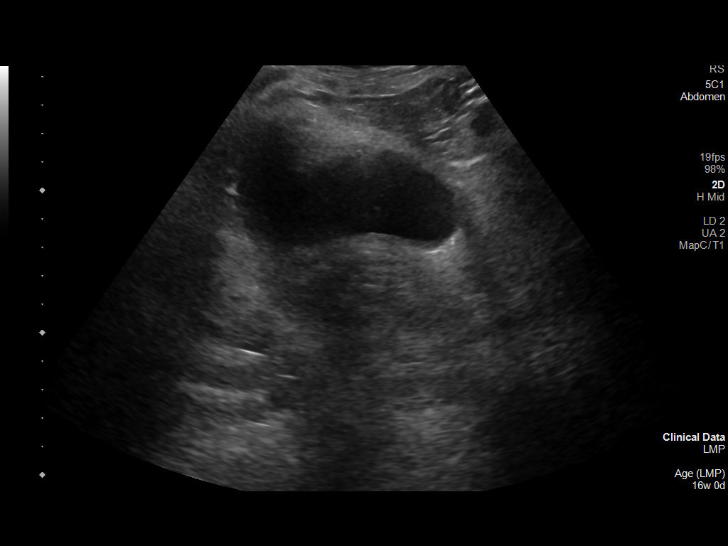
[im 20/104]
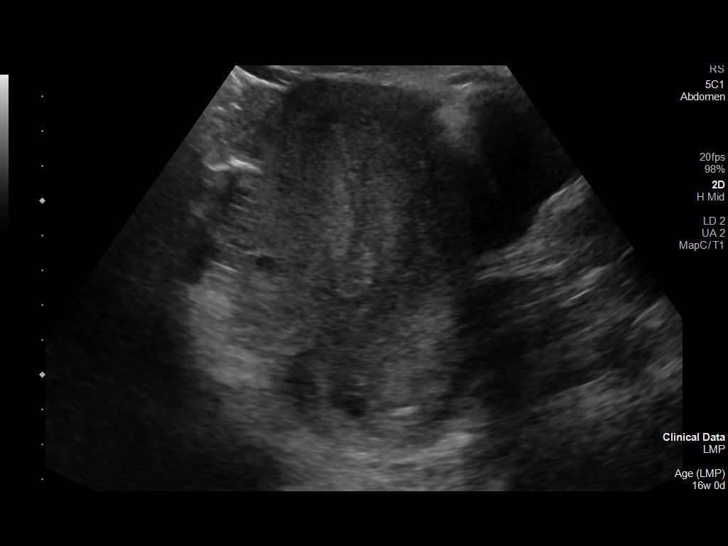
[im 27/104]
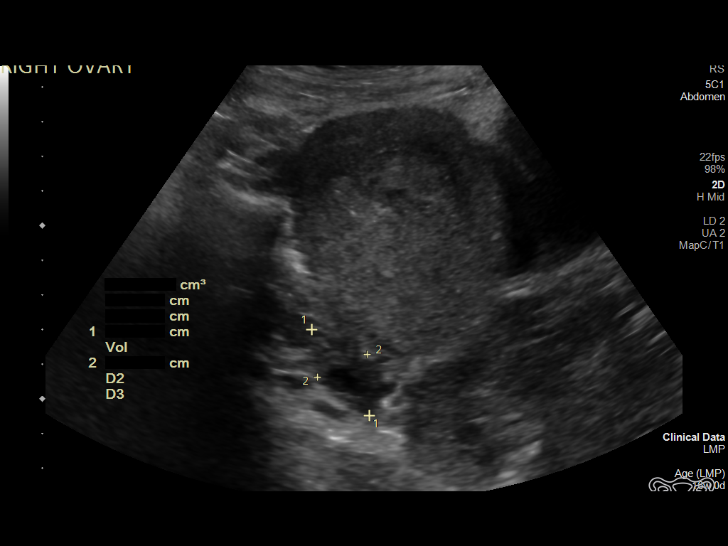
[im 35/104]
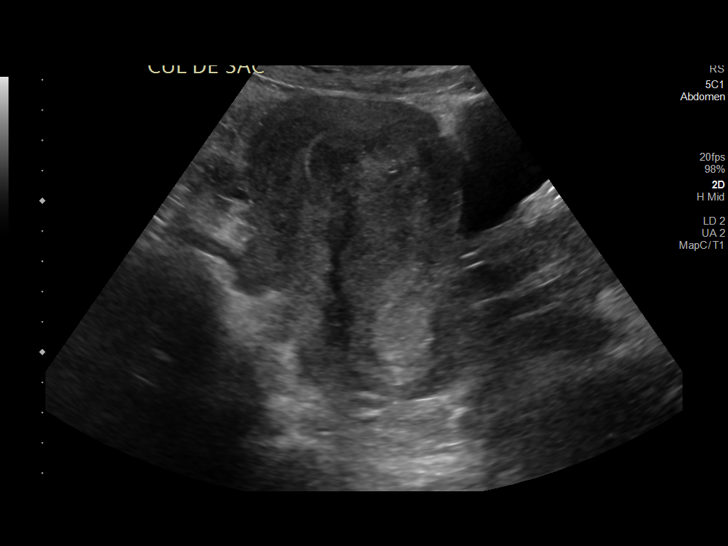
[im 42/104]
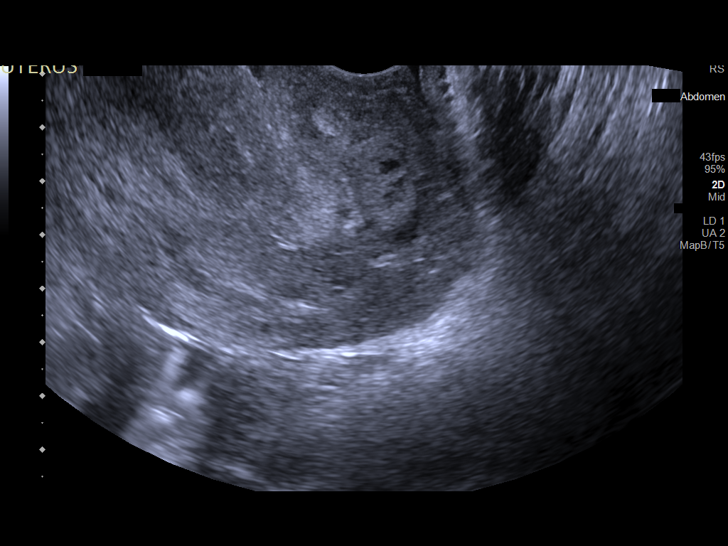
[im 50/104]
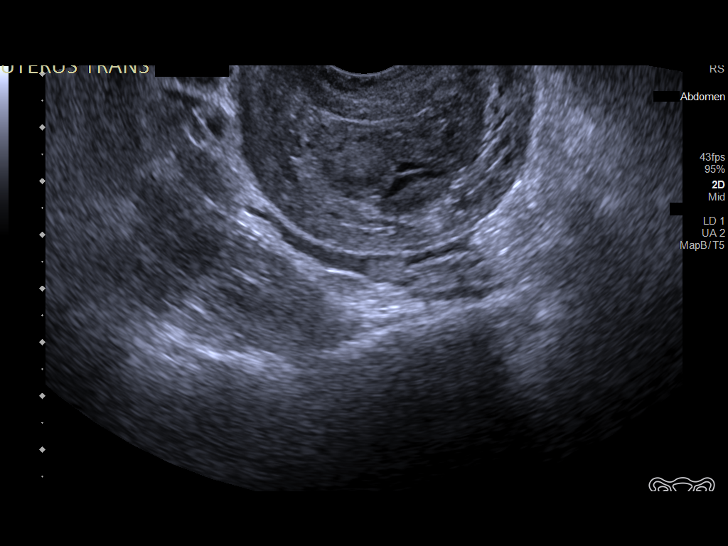
[im 58/104]
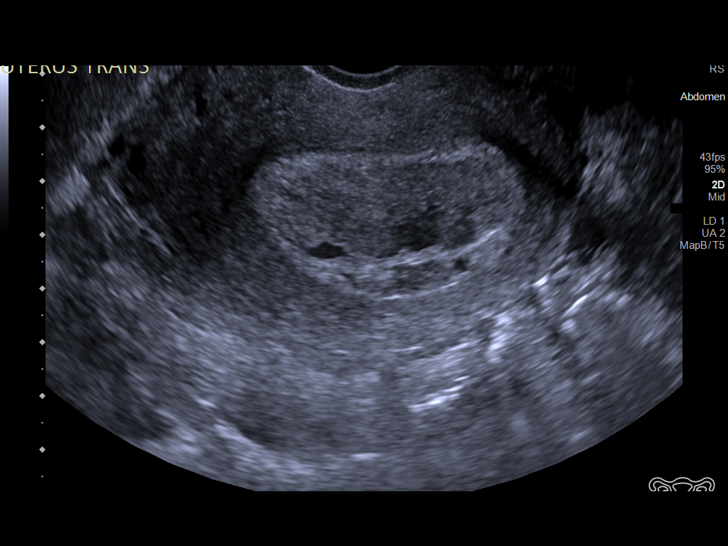
[im 65/104]
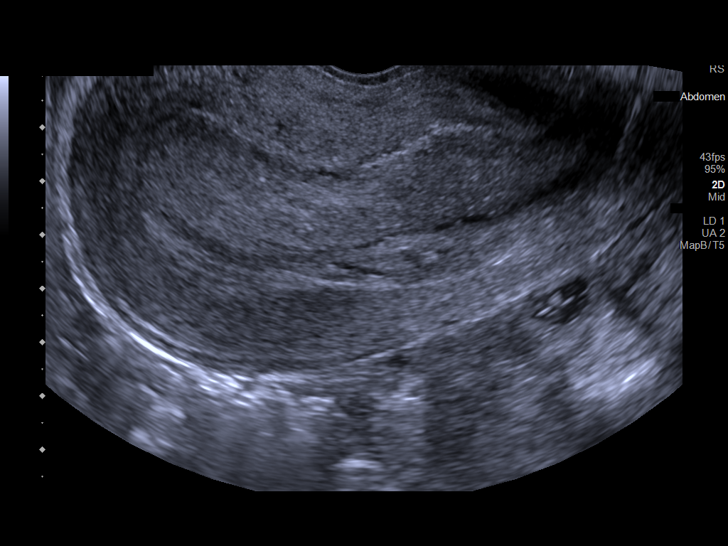
[im 73/104]
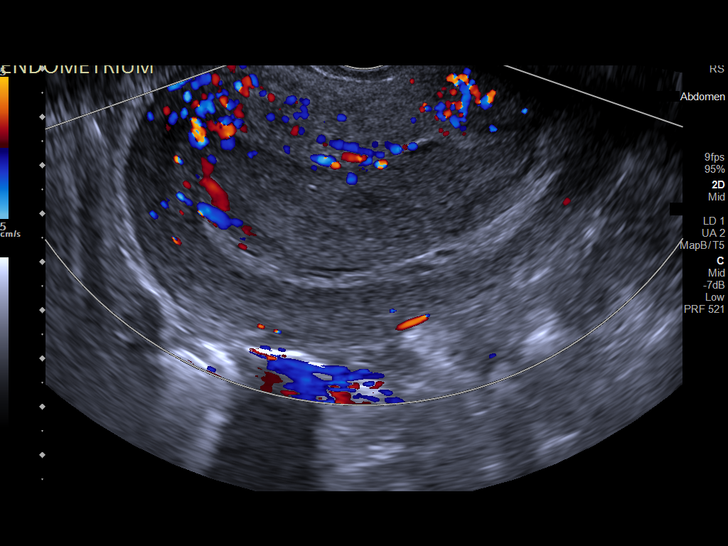
[im 81/104]
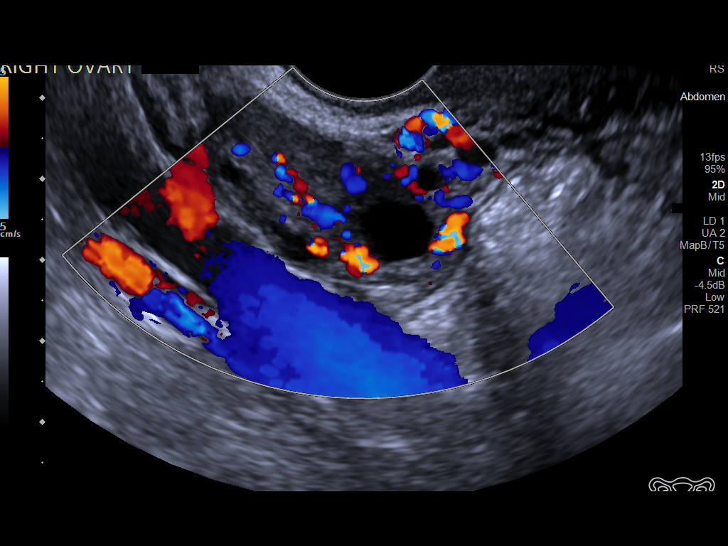
[im 88/104]
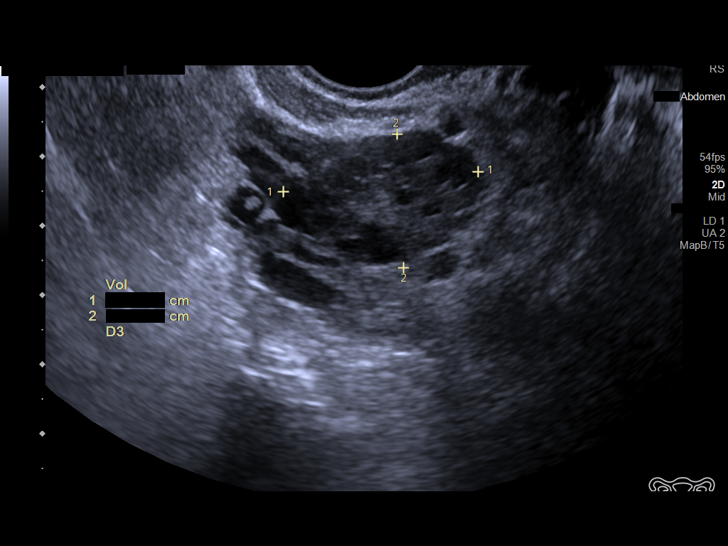
[im 96/104]
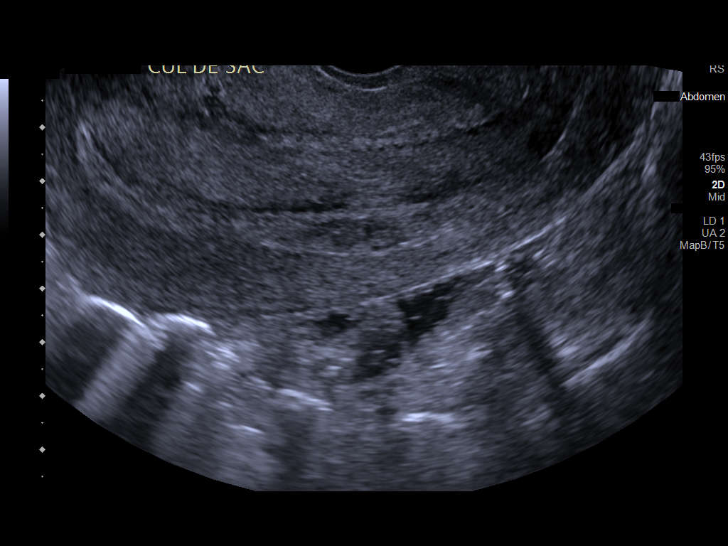
[im 104/104]
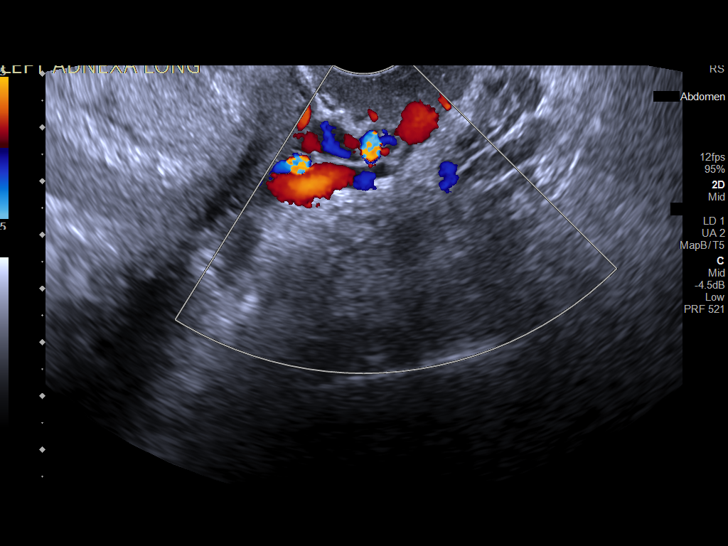

[14 of 28 positions shown; findings below may reference images not displayed]

FINDINGS: Intrauterine gestational sac: None

Maternal uterus/adnexae: The endometrial cavity is distended with
heterogeneous echogenicity material, measuring 3.4 cm in maximum
thickness. Some areas of internal blood flow are seen on color
Doppler ultrasound. These findings are consistent with retained
products of conception.

No fibroids identified. Both ovaries are normal in appearance. No
adnexal mass or abnormal free fluid identified.
IMPRESSION: Sonographic findings are compatible with retained products
conception in the correct clinical setting.

## 2022-05-24 ENCOUNTER — Emergency Department (HOSPITAL_COMMUNITY)
Admission: EM | Admit: 2022-05-24 | Discharge: 2022-05-24 | Disposition: A | Discharge status: Home / Self Care | Payer: Medicaid Other | Attending: Emergency Medicine | Admitting: Emergency Medicine

## 2022-05-24 ENCOUNTER — Other Ambulatory Visit: Payer: Self-pay

## 2022-05-24 ENCOUNTER — Encounter (HOSPITAL_COMMUNITY): Payer: Self-pay | Admitting: Emergency Medicine

## 2022-05-24 ENCOUNTER — Emergency Department (HOSPITAL_COMMUNITY): Payer: Medicaid Other

## 2022-05-24 DIAGNOSIS — M25512 Pain in left shoulder: Secondary | ICD-10-CM | POA: Diagnosis not present

## 2022-05-24 DIAGNOSIS — S060X0A Concussion without loss of consciousness, initial encounter: Secondary | ICD-10-CM | POA: Diagnosis not present

## 2022-05-24 DIAGNOSIS — Y92410 Unspecified street and highway as the place of occurrence of the external cause: Secondary | ICD-10-CM | POA: Diagnosis not present

## 2022-05-24 DIAGNOSIS — S0990XA Unspecified injury of head, initial encounter: Secondary | ICD-10-CM | POA: Diagnosis present

## 2022-05-24 LAB — PREGNANCY, URINE: Preg Test, Ur: NEGATIVE

## 2022-05-24 MED ORDER — ACETAMINOPHEN 500 MG PO TABS
1000.0000 mg | ORAL_TABLET | Freq: Once | ORAL | Status: DC
  Filled 2022-05-24: qty 2

## 2022-05-24 NOTE — Discharge Instructions (Signed)
Stay well-hydrated.  Use Tylenol as needed for headache.  Return to the ED as needed or for any new concerns.  Follow-up with your primary doctor.
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
## 2022-06-08 ENCOUNTER — Emergency Department (HOSPITAL_COMMUNITY)
Admission: EM | Admit: 2022-06-08 | Discharge: 2022-06-08 | Disposition: A | Discharge status: Home / Self Care | Payer: Medicaid Other | Attending: Emergency Medicine | Admitting: Emergency Medicine

## 2022-06-08 ENCOUNTER — Encounter (HOSPITAL_COMMUNITY): Payer: Self-pay

## 2022-06-08 DIAGNOSIS — H9202 Otalgia, left ear: Secondary | ICD-10-CM | POA: Insufficient documentation

## 2022-06-08 DIAGNOSIS — Z20822 Contact with and (suspected) exposure to covid-19: Secondary | ICD-10-CM | POA: Insufficient documentation

## 2022-06-08 DIAGNOSIS — R0981 Nasal congestion: Secondary | ICD-10-CM | POA: Diagnosis present

## 2022-06-08 LAB — SARS CORONAVIRUS 2 BY RT PCR: SARS Coronavirus 2 by RT PCR: NEGATIVE

## 2022-06-08 MED ORDER — AMOXICILLIN 500 MG PO CAPS
500.0000 mg | ORAL_CAPSULE | Freq: Once | ORAL | Status: AC
  Administered 2022-06-08: 500 mg via ORAL
  Filled 2022-06-08: qty 1

## 2022-06-08 MED ORDER — AMOXICILLIN 500 MG PO CAPS: 500.0000 mg | ORAL_CAPSULE | Freq: Three times a day (TID) | ORAL | 0 refills | Status: AC

## 2022-06-08 NOTE — Discharge Instructions (Addendum)
You were evaluated in the Emergency Department and after careful evaluation, we did not find any emergent condition requiring admission or further testing in the hospital.  Your exam/testing today was overall reassuring.  Your pain seems to be due to an ear infection.  Take the amoxicillin as directed.  Continue Tylenol or Motrin for pain.  Please return to the Emergency Department if you experience any worsening of your condition.  Thank you for allowing [place] to be a part of your care.

## 2022-06-08 NOTE — ED Provider Notes (Signed)
[facility] Emergency Department Provider Note MRN:  [id number]  Arrival date & time: 06/08/22     Chief Complaint   Nasal Congestion   History of Present Illness   Autumn Bridges is a 26 y.o. year-old female with no pertinent past medical history presenting to the ED with chief complaint of nasal congestion.  Nasal congestion for 4 days.  Now having ear pain on the left all day today.  Mild cough.  No fever.  No other complaints.  Daughter recently diagnosed with pneumonia.  Review of Systems  A thorough review of systems was obtained and all systems are negative except as noted in the HPI and PMH.   Patient's Health History    Past Medical History:  Diagnosis Date   History of ITP    Medical history non-contributory     Past Surgical History:  Procedure Laterality Date   DILATION AND EVACUATION N/A 01/09/2021   Procedure: DILATATION AND EVACUATION;  Surgeon: Crawford Givens, MD;  Location: [place];  Service: Gynecology;  Laterality: N/A;   NO PAST SURGERIES     OPERATIVE ULTRASOUND N/A 01/09/2021   Procedure: OPERATIVE ULTRASOUND;  Surgeon: Crawford Givens, MD;  Location: [place];  Service: Gynecology;  Laterality: N/A;   WISDOM TOOTH EXTRACTION      Family History  Problem Relation Age of Onset   Diabetes Mother     Social History   Socioeconomic History   Marital status: Single    Spouse name: Not on file   Number of children: Not on file   Years of education: Not on file   Highest education level: Not on file  Occupational History   Not on file  Tobacco Use   Smoking status: Never   Smokeless tobacco: Never  Vaping Use   Vaping Use: Never used  Substance and Sexual Activity   Alcohol use: No   Drug use: No   Sexual activity: Yes  Other Topics Concern   Not on file  Social History Narrative   Not on file   Social Determinants of Health   Financial Resource Strain: Not on file  Food Insecurity: Not on file  Transportation  Needs: Not on file  Physical Activity: Not on file  Stress: Not on file  Social Connections: Not on file  Intimate Partner Violence: Not on file     Physical Exam   Vitals:   06/08/22 2033  BP: (!) 146/79  Pulse: 61  Resp: 16  Temp: 98.4 F (36.9 C)  SpO2: 100%    CONSTITUTIONAL: Well-appearing, NAD NEURO/PSYCH:  Alert and oriented x 3, no focal deficits EYES:  eyes equal and reactive ENT/NECK:  no LAD, no JVD CARDIO: Regular rate, well-perfused, normal S1 and S2 PULM:  CTAB no wheezing or rhonchi GI/GU:  non-distended, non-tender MSK/SPINE:  No gross deformities, no edema SKIN:  no rash, atraumatic   *Additional and/or pertinent findings included in MDM below  Diagnostic and Interventional Summary    EKG Interpretation  Date/Time:    Ventricular Rate:    PR Interval:    QRS Duration:   QT Interval:    QTC Calculation:   R Axis:     Text Interpretation:         Labs Reviewed  SARS CORONAVIRUS 2 BY RT PCR    No orders to display    Medications  amoxicillin (AMOXIL) capsule 500 mg (has no administration in time range)     Procedures  /  Critical Care  Procedures  ED Course and Medical Decision Making  Initial Impression and Ddx Erythematous and bulging left TM, suspicious for otitis media.  Potential bacterial source in the home given the sick contact.  Otherwise reassuring vital signs, appropriate for discharge.  Past medical/surgical history that increases complexity of ED encounter: None  Interpretation of Diagnostics COVID test is negative  Patient Reassessment and Ultimate Disposition/Management     Discharge  Patient management required discussion with the following services or consulting groups:  None  Complexity of Problems Addressed Acute complicated illness or Injury  Additional Data Reviewed and Analyzed Further history obtained from: None  Additional Factors Impacting ED Encounter Risk Prescriptions  Elmer Sow. Pilar Plate,  MD [facility] Emergency Medicine [facility] [email or web address]  Final Clinical Impressions(s) / ED Diagnoses     ICD-10-CM   1. Nasal congestion  R09.81     2. Left ear pain  H92.02       ED Discharge Orders          Ordered    amoxicillin (AMOXIL) 500 MG capsule  3 times daily        06/08/22 2315             Discharge Instructions Discussed with and Provided to Patient:    Discharge Instructions      You were evaluated in the Emergency Department and after careful evaluation, we did not find any emergent condition requiring admission or further testing in the hospital.  Your exam/testing today was overall reassuring.  Your pain seems to be due to an ear infection.  Take the amoxicillin as directed.  Continue Tylenol or Motrin for pain.  Please return to the Emergency Department if you experience any worsening of your condition.  Thank you for allowing [place] to be a part of your care.       Sabas Sous, MD 06/08/22 2325

## 2022-06-08 NOTE — ED Triage Notes (Signed)
Pt reports nasal congestion, cough and L ear pain that started today, denies fevers
